# Patient Record
Sex: Female | Born: 1970 | Race: Black or African American | Hispanic: No | Marital: Single | Smoking: Former smoker
Health system: Southern US, Community
[De-identification: ages and names within clinical notes are randomized; demographics above are authoritative.]

## PROBLEM LIST (undated history)

## (undated) DIAGNOSIS — K219 Gastro-esophageal reflux disease without esophagitis: Secondary | ICD-10-CM

## (undated) DIAGNOSIS — I10 Essential (primary) hypertension: Secondary | ICD-10-CM

## (undated) DIAGNOSIS — E119 Type 2 diabetes mellitus without complications: Secondary | ICD-10-CM

## (undated) DIAGNOSIS — E7800 Pure hypercholesterolemia, unspecified: Secondary | ICD-10-CM

## (undated) DIAGNOSIS — E785 Hyperlipidemia, unspecified: Secondary | ICD-10-CM

## (undated) HISTORY — PX: DILATION AND CURETTAGE OF UTERUS: SHX78

## (undated) HISTORY — PX: HERNIA REPAIR: SHX51

---

## 2018-04-23 ENCOUNTER — Other Ambulatory Visit: Payer: Self-pay

## 2018-04-23 ENCOUNTER — Encounter (HOSPITAL_COMMUNITY): Payer: Self-pay

## 2018-04-23 ENCOUNTER — Emergency Department (HOSPITAL_COMMUNITY): Payer: Medicare Other

## 2018-04-23 ENCOUNTER — Emergency Department (HOSPITAL_COMMUNITY)
Admission: EM | Admit: 2018-04-23 | Discharge: 2018-04-23 | Disposition: A | Discharge status: Home / Self Care | Payer: Medicare Other | Attending: Emergency Medicine | Admitting: Emergency Medicine

## 2018-04-23 DIAGNOSIS — Z87891 Personal history of nicotine dependence: Secondary | ICD-10-CM | POA: Insufficient documentation

## 2018-04-23 DIAGNOSIS — R2243 Localized swelling, mass and lump, lower limb, bilateral: Secondary | ICD-10-CM | POA: Diagnosis not present

## 2018-04-23 DIAGNOSIS — I10 Essential (primary) hypertension: Secondary | ICD-10-CM | POA: Insufficient documentation

## 2018-04-23 DIAGNOSIS — R05 Cough: Secondary | ICD-10-CM | POA: Diagnosis not present

## 2018-04-23 DIAGNOSIS — R062 Wheezing: Secondary | ICD-10-CM | POA: Insufficient documentation

## 2018-04-23 DIAGNOSIS — M25471 Effusion, right ankle: Secondary | ICD-10-CM

## 2018-04-23 DIAGNOSIS — M25472 Effusion, left ankle: Secondary | ICD-10-CM

## 2018-04-23 DIAGNOSIS — R059 Cough, unspecified: Secondary | ICD-10-CM

## 2018-04-23 LAB — COMPREHENSIVE METABOLIC PANEL
ALT: 18 U/L (ref 0–44)
AST: 22 U/L (ref 15–41)
Albumin: 3.3 g/dL — ABNORMAL LOW (ref 3.5–5.0)
Alkaline Phosphatase: 66 U/L (ref 38–126)
Anion gap: 12 (ref 5–15)
BUN: <5 mg/dL — ABNORMAL LOW (ref 6–20)
CO2: 22 mmol/L (ref 22–32)
Calcium: 9.1 mg/dL (ref 8.9–10.3)
Chloride: 105 mmol/L (ref 98–111)
Creatinine, Ser: 0.71 mg/dL (ref 0.44–1.00)
GFR calc Af Amer: >60 mL/min (ref >60)
GFR calc non Af Amer: >60 mL/min (ref >60)
Glucose, Bld: 136 mg/dL — ABNORMAL HIGH (ref 70–99)
Potassium: 4.1 mmol/L (ref 3.5–5.1)
Sodium: 139 mmol/L (ref 135–145)
Total Bilirubin: 0.6 mg/dL (ref 0.3–1.2)
Total Protein: 7.1 g/dL (ref 6.5–8.1)

## 2018-04-23 LAB — I-STAT TROPONIN, ED: Troponin i, poc: 0.01 ng/mL (ref 0.00–0.08)

## 2018-04-23 LAB — BRAIN NATRIURETIC PEPTIDE: B Natriuretic Peptide: 40.7 pg/mL (ref 0.0–100.0)

## 2018-04-23 LAB — CBC
HCT: 42 % (ref 36.0–46.0)
Hemoglobin: 13.2 g/dL (ref 12.0–15.0)
MCH: 29.5 pg (ref 26.0–34.0)
MCHC: 31.4 g/dL (ref 30.0–36.0)
MCV: 94 fL (ref 80.0–100.0)
Platelets: 320 10*3/uL (ref 150–400)
RBC: 4.47 MIL/uL (ref 3.87–5.11)
RDW: 12.1 % (ref 11.5–15.5)
WBC: 9.8 10*3/uL (ref 4.0–10.5)
nRBC: 0 % (ref 0.0–0.2)

## 2018-04-23 LAB — CBG MONITORING, ED
Glucose-Capillary: 53 mg/dL — ABNORMAL LOW (ref 70–99)
Glucose-Capillary: 78 mg/dL (ref 70–99)
Glucose-Capillary: 114 mg/dL — ABNORMAL HIGH (ref 70–99)

## 2018-04-23 MED ORDER — HYDROCODONE-ACETAMINOPHEN 5-325 MG PO TABS
1.0000 | ORAL_TABLET | Freq: Once | ORAL | Status: AC
  Administered 2018-04-23: 1 via ORAL
  Filled 2018-04-23: qty 1

## 2018-04-23 MED ORDER — SODIUM CHLORIDE 0.9 % IV SOLN
1.0000 g | Freq: Once | INTRAVENOUS | Status: AC
  Administered 2018-04-23: 1 g via INTRAVENOUS
  Filled 2018-04-23: qty 10

## 2018-04-23 MED ORDER — HYDROCHLOROTHIAZIDE 25 MG PO TABS: 25.0000 mg | ORAL_TABLET | Freq: Every day | ORAL | 0 refills | Status: DC

## 2018-04-23 MED ORDER — AMOXICILLIN 500 MG PO CAPS: 1000.0000 mg | ORAL_CAPSULE | Freq: Three times a day (TID) | ORAL | 0 refills | Status: DC

## 2018-04-23 MED ORDER — AZITHROMYCIN 250 MG PO TABS: 250.0000 mg | ORAL_TABLET | Freq: Every day | ORAL | 0 refills | Status: AC

## 2018-04-23 MED ORDER — ALBUTEROL SULFATE (2.5 MG/3ML) 0.083% IN NEBU
5.0000 mg | INHALATION_SOLUTION | Freq: Once | RESPIRATORY_TRACT | Status: AC
  Administered 2018-04-23: 5 mg via RESPIRATORY_TRACT
  Filled 2018-04-23: qty 6

## 2018-04-23 MED ORDER — SODIUM CHLORIDE 0.9 % IV SOLN
500.0000 mg | Freq: Once | INTRAVENOUS | Status: AC
  Administered 2018-04-23: 500 mg via INTRAVENOUS
  Filled 2018-04-23: qty 500

## 2018-04-23 MED ORDER — ALBUTEROL SULFATE HFA 108 (90 BASE) MCG/ACT IN AERS: 2.0000 | INHALATION_SPRAY | RESPIRATORY_TRACT | 1 refills | Status: DC | PRN

## 2018-04-23 MED ORDER — IPRATROPIUM BROMIDE 0.02 % IN SOLN
0.5000 mg | Freq: Once | RESPIRATORY_TRACT | Status: AC
  Administered 2018-04-23: 0.5 mg via RESPIRATORY_TRACT
  Filled 2018-04-23: qty 2.5

## 2018-04-23 MED ORDER — SODIUM CHLORIDE 0.9 % IV SOLN
INTRAVENOUS | Status: DC | PRN
  Administered 2018-04-23: 500 mL via INTRAVENOUS

## 2018-04-23 MED ORDER — FUROSEMIDE 10 MG/ML IJ SOLN
20.0000 mg | Freq: Once | INTRAMUSCULAR | Status: AC
  Administered 2018-04-23: 20 mg via INTRAVENOUS
  Filled 2018-04-23: qty 2

## 2018-04-23 NOTE — ED Triage Notes (Signed)
Pt endorses cough with mucous, shob x 2 weeks. Hx of pleurisy 3 years ago and this feels similar. Tachy. Axox4, Speaking in complete sentences.

## 2018-04-23 NOTE — Discharge Instructions (Addendum)
It was our pleasure to provide your ER care today - we hope that you feel better.  Take antibiotics as prescribed (amoxicillin + zithromax).  Use albuterol inhaler as need.   For ankle swelling and blood pressure control - limit salt intake, elevate legs, and take HCTZ (diuretic type blood pressure medication) as prescribed.   Follow up with primary care doctor for recheck later this week.  Return to ER right away if worse, increased difficulty breathing, chest pain, weak/fainting, other concern.

## 2018-04-23 NOTE — ED Notes (Addendum)
Pt requested CBG to be checked, CBG 53. Pt given 2 cups of orange juice to drink. Axox4

## 2018-04-23 NOTE — ED Notes (Signed)
Pt stable and ambulatory for discharge, states understanding follow up.  

## 2018-04-23 NOTE — ED Provider Notes (Signed)
[facility] EMERGENCY DEPARTMENT Provider Note   CSN: [id number] Arrival date & time: 04/23/18  1152     History   Chief Complaint Chief Complaint  Patient presents with  . Shortness of Breath    HPI Alexandra Elliott is a 47 y.o. female.  Patient c/o productive cough in the past 1-2 weeks. Cough episodic, persistent, moderate. Occasionally produces greenish/yellow phlegm. Had scratchy/sore throat, but that has improved. +nasal congestion/rhinorrhea. subjective fever.  Denies chest pain or discomfort. No specific known ill contacts. +mild bilateral, symmetric ankle edema. No unilateral leg pain or swelling. Denies pnd. States compliant w home meds.   The history is provided by the patient.  Shortness of Breath  Associated symptoms include a fever, rhinorrhea, sore throat, cough and wheezing. Pertinent negatives include no headaches, no neck pain, no chest pain, no vomiting, no abdominal pain and no rash.    History reviewed. No pertinent past medical history.  There are no active problems to display for this patient.   History reviewed. No pertinent surgical history.   OB History   No obstetric history on file.      Home Medications    Prior to Admission medications   Not on File    Family History History reviewed. No pertinent family history.  Social History Social History   Tobacco Use  . Smoking status: Former Smoker  Substance Use Topics  . Alcohol use: Never    Frequency: Never  . Drug use: Never     Allergies   Levofloxacin and Lisinopril   Review of Systems Review of Systems  Constitutional: Positive for fever.  HENT: Positive for congestion, rhinorrhea and sore throat.   Eyes: Negative for redness.  Respiratory: Positive for cough, shortness of breath and wheezing.   Cardiovascular: Negative for chest pain.       Bil ankle edema.   Gastrointestinal: Negative for abdominal pain, diarrhea and vomiting.  Genitourinary:  Negative for flank pain.  Musculoskeletal: Negative for back pain, neck pain and neck stiffness.  Skin: Negative for rash.  Neurological: Negative for headaches.  Hematological: Does not bruise/bleed easily.  Psychiatric/Behavioral: Negative for confusion.     Physical Exam Updated Vital Signs BP (!) 169/91 (BP Location: Right Arm)   Pulse (!) 120   Temp 98.1 F (36.7 C) (Oral)   Resp 20   Ht 1.6 m (5\' 3" )   Wt (!) 154.2 kg   LMP 04/02/2018 (Exact Date)   SpO2 99%   BMI 60.23 kg/m   Physical Exam Vitals signs and nursing note reviewed.  Constitutional:      Appearance: Normal appearance. She is well-developed.  HENT:     Head: Atraumatic.     Nose: Congestion and rhinorrhea present.     Mouth/Throat:     Mouth: Mucous membranes are moist.     Pharynx: Oropharynx is clear. No oropharyngeal exudate.  Eyes:     General: No scleral icterus.    Conjunctiva/sclera: Conjunctivae normal.     Pupils: Pupils are equal, round, and reactive to light.  Neck:     Musculoskeletal: Normal range of motion and neck supple. No neck rigidity or muscular tenderness.     Trachea: No tracheal deviation.  Cardiovascular:     Rate and Rhythm: Regular rhythm.     Pulses: Normal pulses.     Heart sounds: Normal heart sounds. No murmur. No friction rub. No gallop.   Pulmonary:     Effort: Pulmonary effort is normal. No respiratory  distress.     Breath sounds: Wheezing present.     Comments: Rhonchi left.  Abdominal:     General: Abdomen is flat. Bowel sounds are normal. There is no distension.     Palpations: Abdomen is soft.     Tenderness: There is no abdominal tenderness.     Comments: Obese.   Genitourinary:    Comments: No cva tenderness.  Musculoskeletal:        General: No tenderness.     Comments: Bilateral ankle edema, mild-mod, no asymmetric lower extremity edema. No leg pain/tenderness.   Skin:    General: Skin is warm and dry.     Findings: No rash.  Neurological:      Mental Status: She is alert.     Comments: Speech clear/fluent.   Psychiatric:        Mood and Affect: Mood normal.      ED Treatments / Results  Labs (all labs ordered are listed, but only abnormal results are displayed) Results for orders placed or performed during the hospital encounter of 04/23/18  Comprehensive metabolic panel  Result Value Ref Range   Sodium 139 135 - 145 mmol/L   Potassium 4.1 3.5 - 5.1 mmol/L   Chloride 105 98 - 111 mmol/L   CO2 22 22 - 32 mmol/L   Glucose, Bld 136 (H) 70 - 99 mg/dL   BUN <5 (L) 6 - 20 mg/dL   Creatinine, Ser 5.620.71 0.44 - 1.00 mg/dL   Calcium 9.1 8.9 - 13.010.3 mg/dL   Total Protein 7.1 6.5 - 8.1 g/dL   Albumin 3.3 (L) 3.5 - 5.0 g/dL   AST 22 15 - 41 U/L   ALT 18 0 - 44 U/L   Alkaline Phosphatase 66 38 - 126 U/L   Total Bilirubin 0.6 0.3 - 1.2 mg/dL   GFR calc non Af Amer >60 >60 mL/min   GFR calc Af Amer >60 >60 mL/min   Anion gap 12 5 - 15  CBC  Result Value Ref Range   WBC 9.8 4.0 - 10.5 K/uL   RBC 4.47 3.87 - 5.11 MIL/uL   Hemoglobin 13.2 12.0 - 15.0 g/dL   HCT 86.542.0 78.436.0 - 69.646.0 %   MCV 94.0 80.0 - 100.0 fL   MCH 29.5 26.0 - 34.0 pg   MCHC 31.4 30.0 - 36.0 g/dL   RDW 29.512.1 28.411.5 - 13.215.5 %   Platelets 320 150 - 400 K/uL   nRBC 0.0 0.0 - 0.2 %  Brain natriuretic peptide  Result Value Ref Range   B Natriuretic Peptide 40.7 0.0 - 100.0 pg/mL  CBG monitoring, ED  Result Value Ref Range   Glucose-Capillary 53 (L) 70 - 99 mg/dL  CBG monitoring, ED  Result Value Ref Range   Glucose-Capillary 78 70 - 99 mg/dL  I-stat troponin, ED  Result Value Ref Range   Troponin i, poc 0.01 0.00 - 0.08 ng/mL   Comment 3          CBG monitoring, ED  Result Value Ref Range   Glucose-Capillary 114 (H) 70 - 99 mg/dL   Dg Chest 2 View  Result Date: 04/23/2018 CLINICAL DATA:  Shortness of breath for 2 weeks EXAM: CHEST - 2 VIEW COMPARISON:  11/04/2017 FINDINGS: Diffuse bilateral mild interstitial thickening. No pleural effusion or pneumothorax.  Stable cardiomediastinal silhouette. No acute osseous abnormality. IMPRESSION: Diffuse bilateral interstitial thickening which may reflect mild interstitial edema versus interstitial infection. Electronically Signed   By: Elige KoHetal  Patel  On: 04/23/2018 13:51    EKG EKG Interpretation  Date/Time:  Tuesday April 23 2018 12:21:55 EST Ventricular Rate:  119 PR Interval:  154 QRS Duration: 70 QT Interval:  316 QTC Calculation: 444 R Axis:   4 Text Interpretation:  Sinus tachycardia Low voltage QRS No old tracing to compare Confirmed by Cathren LaineSteinl, Ladd Cen ([id number]) on 04/23/2018 12:31:16 PM   Radiology No results found.  Procedures Procedures (including critical care time)  Medications Ordered in ED Medications  albuterol (PROVENTIL) (2.5 MG/3ML) 0.083% nebulizer solution 5 mg (has no administration in time range)     Initial Impression / Assessment and Plan / ED Course  I have reviewed the triage vital signs and the nursing notes.  Pertinent labs & imaging results that were available during my care of the patient were reviewed by me and considered in my medical decision making (see chart for details).  Cxr. Alb neb.  Reviewed nursing notes and prior charts for additional history.   cxr reviewed - ?mild vascular congestion vs interstitial/infectious change. Pt denies pnd. Does have mild bil ankle edema. Dose of lasix given. Labs added.   Labs reviewed - trop is normal. bnp is not elevated.   Post initial neb, wheezing improved, but mild wheezing persists. Additional neb given. Given prod cough, green/yellow sputum, congestion on exam, will give abx rx.   Initial blood sugar was midlly low (pt hadnt eaten then) - pt given po fluids/food - repeat cbg normal.   Pt currently breathing comfortable, no chest pain. Good air exchange. Pt currently appears stable for d/c.   rec close pcp f/u.  Return precautions provided.     Final Clinical Impressions(s) / ED Diagnoses   Final  diagnoses:  None    ED Discharge Orders    None       Cathren LaineSteinl, Katelyne Galster, MD 04/23/18 1715

## 2018-05-10 ENCOUNTER — Observation Stay (HOSPITAL_COMMUNITY)
Admission: EM | Admit: 2018-05-10 | Discharge: 2018-05-12 | Disposition: A | Discharge status: Home / Self Care | Payer: Medicare Other | Attending: Internal Medicine | Admitting: Internal Medicine

## 2018-05-10 ENCOUNTER — Emergency Department (HOSPITAL_COMMUNITY): Payer: Medicare Other

## 2018-05-10 ENCOUNTER — Encounter (HOSPITAL_COMMUNITY): Payer: Self-pay

## 2018-05-10 DIAGNOSIS — E11649 Type 2 diabetes mellitus with hypoglycemia without coma: Secondary | ICD-10-CM | POA: Insufficient documentation

## 2018-05-10 DIAGNOSIS — K219 Gastro-esophageal reflux disease without esophagitis: Secondary | ICD-10-CM | POA: Diagnosis present

## 2018-05-10 DIAGNOSIS — Z7982 Long term (current) use of aspirin: Secondary | ICD-10-CM | POA: Insufficient documentation

## 2018-05-10 DIAGNOSIS — J181 Lobar pneumonia, unspecified organism: Secondary | ICD-10-CM | POA: Diagnosis not present

## 2018-05-10 DIAGNOSIS — Z6841 Body Mass Index (BMI) 40.0 and over, adult: Secondary | ICD-10-CM | POA: Diagnosis not present

## 2018-05-10 DIAGNOSIS — E119 Type 2 diabetes mellitus without complications: Secondary | ICD-10-CM

## 2018-05-10 DIAGNOSIS — Z833 Family history of diabetes mellitus: Secondary | ICD-10-CM | POA: Diagnosis not present

## 2018-05-10 DIAGNOSIS — Z79899 Other long term (current) drug therapy: Secondary | ICD-10-CM | POA: Diagnosis not present

## 2018-05-10 DIAGNOSIS — J189 Pneumonia, unspecified organism: Secondary | ICD-10-CM

## 2018-05-10 DIAGNOSIS — R000 Tachycardia, unspecified: Secondary | ICD-10-CM | POA: Diagnosis not present

## 2018-05-10 DIAGNOSIS — Z888 Allergy status to other drugs, medicaments and biological substances status: Secondary | ICD-10-CM | POA: Insufficient documentation

## 2018-05-10 DIAGNOSIS — R600 Localized edema: Secondary | ICD-10-CM | POA: Diagnosis not present

## 2018-05-10 DIAGNOSIS — Z87891 Personal history of nicotine dependence: Secondary | ICD-10-CM | POA: Insufficient documentation

## 2018-05-10 DIAGNOSIS — I10 Essential (primary) hypertension: Secondary | ICD-10-CM | POA: Diagnosis present

## 2018-05-10 DIAGNOSIS — J9601 Acute respiratory failure with hypoxia: Secondary | ICD-10-CM | POA: Diagnosis present

## 2018-05-10 DIAGNOSIS — R05 Cough: Secondary | ICD-10-CM | POA: Diagnosis present

## 2018-05-10 DIAGNOSIS — Z881 Allergy status to other antibiotic agents status: Secondary | ICD-10-CM | POA: Insufficient documentation

## 2018-05-10 DIAGNOSIS — Z590 Homelessness: Secondary | ICD-10-CM | POA: Insufficient documentation

## 2018-05-10 DIAGNOSIS — A419 Sepsis, unspecified organism: Secondary | ICD-10-CM | POA: Diagnosis present

## 2018-05-10 DIAGNOSIS — E162 Hypoglycemia, unspecified: Secondary | ICD-10-CM | POA: Diagnosis present

## 2018-05-10 DIAGNOSIS — Z794 Long term (current) use of insulin: Secondary | ICD-10-CM | POA: Insufficient documentation

## 2018-05-10 DIAGNOSIS — R0602 Shortness of breath: Secondary | ICD-10-CM | POA: Diagnosis present

## 2018-05-10 HISTORY — DX: Gastro-esophageal reflux disease without esophagitis: K21.9

## 2018-05-10 HISTORY — DX: Type 2 diabetes mellitus without complications: E11.9

## 2018-05-10 HISTORY — DX: Essential (primary) hypertension: I10

## 2018-05-10 LAB — CBC WITH DIFFERENTIAL/PLATELET
Abs Immature Granulocytes: 0.03 10*3/uL (ref 0.00–0.07)
Basophils Absolute: 0.1 10*3/uL (ref 0.0–0.1)
Basophils Relative: 1 %
Eosinophils Absolute: 0.3 10*3/uL (ref 0.0–0.5)
Eosinophils Relative: 4 %
HCT: 43.2 % (ref 36.0–46.0)
Hemoglobin: 13.7 g/dL (ref 12.0–15.0)
Immature Granulocytes: 0 %
LYMPHS PCT: 39 %
Lymphs Abs: 3.3 10*3/uL (ref 0.7–4.0)
MCH: 29.5 pg (ref 26.0–34.0)
MCHC: 31.7 g/dL (ref 30.0–36.0)
MCV: 92.9 fL (ref 80.0–100.0)
MONOS PCT: 12 %
Monocytes Absolute: 1 10*3/uL (ref 0.1–1.0)
Neutro Abs: 3.7 10*3/uL (ref 1.7–7.7)
Neutrophils Relative %: 44 %
Platelets: 378 10*3/uL (ref 150–400)
RBC: 4.65 MIL/uL (ref 3.87–5.11)
RDW: 11.9 % (ref 11.5–15.5)
WBC: 8.4 10*3/uL (ref 4.0–10.5)
nRBC: 0 % (ref 0.0–0.2)

## 2018-05-10 LAB — COMPREHENSIVE METABOLIC PANEL
ALT: 19 U/L (ref 0–44)
AST: 30 U/L (ref 15–41)
Albumin: 3.6 g/dL (ref 3.5–5.0)
Alkaline Phosphatase: 58 U/L (ref 38–126)
Anion gap: 10 (ref 5–15)
BUN: 7 mg/dL (ref 6–20)
CO2: 26 mmol/L (ref 22–32)
CREATININE: 0.79 mg/dL (ref 0.44–1.00)
Calcium: 9.3 mg/dL (ref 8.9–10.3)
Chloride: 101 mmol/L (ref 98–111)
GFR calc Af Amer: >60 mL/min (ref >60)
GFR calc non Af Amer: >60 mL/min (ref >60)
Glucose, Bld: 153 mg/dL — ABNORMAL HIGH (ref 70–99)
Potassium: 4.6 mmol/L (ref 3.5–5.1)
Sodium: 137 mmol/L (ref 135–145)
Total Bilirubin: 1.3 mg/dL — ABNORMAL HIGH (ref 0.3–1.2)
Total Protein: 7.4 g/dL (ref 6.5–8.1)

## 2018-05-10 LAB — I-STAT BETA HCG BLOOD, ED (MC, WL, AP ONLY): I-stat hCG, quantitative: <5 m[IU]/mL (ref <5)

## 2018-05-10 LAB — BRAIN NATRIURETIC PEPTIDE: B Natriuretic Peptide: 15.1 pg/mL (ref 0.0–100.0)

## 2018-05-10 LAB — D-DIMER, QUANTITATIVE: D-Dimer, Quant: 0.73 ug/mL-FEU — ABNORMAL HIGH (ref 0.00–0.50)

## 2018-05-10 LAB — I-STAT CG4 LACTIC ACID, ED: Lactic Acid, Venous: 1.44 mmol/L (ref 0.5–1.9)

## 2018-05-10 MED ORDER — IOPAMIDOL (ISOVUE-370) INJECTION 76%
100.0000 mL | Freq: Once | INTRAVENOUS | Status: AC | PRN
  Administered 2018-05-10: 100 mL via INTRAVENOUS

## 2018-05-10 MED ORDER — KETOROLAC TROMETHAMINE 30 MG/ML IJ SOLN
30.0000 mg | Freq: Once | INTRAMUSCULAR | Status: AC
  Administered 2018-05-10: 30 mg via INTRAVENOUS
  Filled 2018-05-10: qty 1

## 2018-05-10 MED ORDER — SODIUM CHLORIDE 0.9 % IV BOLUS: 1000.0000 mL | Freq: Once | INTRAVENOUS | Status: DC

## 2018-05-10 MED ORDER — IPRATROPIUM-ALBUTEROL 0.5-2.5 (3) MG/3ML IN SOLN
3.0000 mL | Freq: Once | RESPIRATORY_TRACT | Status: AC
  Administered 2018-05-10: 3 mL via RESPIRATORY_TRACT
  Filled 2018-05-10: qty 3

## 2018-05-10 NOTE — ED Notes (Signed)
Taken to CT at this time. 

## 2018-05-10 NOTE — ED Triage Notes (Signed)
Pt comes via [facility] from [place] [facility] [place], recent diagnoses of URI, now having SOB, worse with movement and walking, unrelieved with MDI, rhonchi in lower lobes, coughing up yellow phlegm, bilateral leg swelling for the last several weeks.

## 2018-05-10 NOTE — ED Provider Notes (Addendum)
[facility] Provider [facility]   CSN: [id number] Arrival date & time: 05/10/18  2106     History   Chief Complaint Chief Complaint  Patient presents with  . Shortness of Breath  . URI    HPI Alexandra Elliott is a 48 y.o. female.  The history is provided by the patient and medical records. No language interpreter was used.  Shortness of Breath  Associated symptoms: chest pain and cough   Associated symptoms: no wheezing   URI  Presenting symptoms: cough   Associated symptoms: no wheezing    Alexandra Elliott is a 48 y.o. female  with a PMH of DM, HTN, GERD who presents to the Emergency Department complaining of persistent cough and shortness of breath. Patient states that her symptoms actually began about mid December.  She was seen in the emergency department on 12/24.  X-ray at that time showed vascular congestion versus infectious process.  She was treated with azithromycin and amoxicillin which she took until completion.  She felt as if she got somewhat better, but her symptoms never truly resolved.  She then began feeling worse 2 or 3 days ago.  She has been using the albuterol inhaler every couple of hours which is not helping anymore.  She was also started on HCTZ for associated lower extremity swelling.  Swelling is bilateral and has been progressively worsening over the last month.  She is very distraught about her persistent coughing fits.  She states that several times over the last week or 2, she has had streaks of blood in the mucus.  She also reports central and left lateral chest discomfort which gets worse when she takes a big breath or coughs.  She gets winded very easily.  She denies any exertional chest pain, but this does worsen her shortness of breath.  She denies any fevers.  No history of CHF.  No recent travel/immobilizations.  Not on OCPs or hormones.   Past Medical History:  Diagnosis Date  . Diabetes mellitus without  complication (HCC)   . GERD (gastroesophageal reflux disease)   . Hypertension     There are no active problems to display for this patient.   Past Surgical History:  Procedure Laterality Date  . DILATION AND CURETTAGE OF UTERUS       OB History   No obstetric history on file.      Home Medications    Prior to Admission medications   Medication Sig Start Date End Date Taking? Authorizing Provider  albuterol (PROVENTIL HFA;VENTOLIN HFA) 108 (90 Base) MCG/ACT inhaler Inhale 2 puffs into the lungs every 4 (four) hours as needed for wheezing or shortness of breath. 04/23/18  Yes Cathren LaineSteinl, Kevin, MD  amLODipine (NORVASC) 10 MG tablet Take 10 mg by mouth daily.   Yes [provider]  aspirin 81 MG EC tablet Take 81 mg by mouth daily.   Yes [provider]  fenofibrate 160 MG tablet Take 160 mg by mouth daily.   Yes [provider]  ferrous sulfate 325 (65 FE) MG EC tablet Take 325 mg by mouth daily.   Yes [provider]  hydrochlorothiazide (HYDRODIURIL) 25 MG tablet Take 1 tablet (25 mg total) by mouth daily. 04/23/18  Yes Cathren LaineSteinl, Kevin, MD  hydrOXYzine (ATARAX/VISTARIL) 25 MG tablet Take 25 mg by mouth 3 (three) times daily.   Yes [provider]  Insulin Glargine, 2 Unit Dial, (TOUJEO MAX SOLOSTAR) 300 UNIT/ML SOPN Inject 40 Units into the  skin daily.   Yes [provider]  insulin lispro (HUMALOG KWIKPEN) 100 UNIT/ML KwikPen Inject 25 Units into the skin See admin instructions. Inject 25 units with each meal, and 10 units with a snack at night.   Yes [provider]  losartan (COZAAR) 25 MG tablet Take 25 mg by mouth daily.   Yes [provider]  lovastatin (MEVACOR) 40 MG tablet Take 40 mg by mouth at bedtime.   Yes [provider]  pantoprazole (PROTONIX) 40 MG tablet Take 40 mg by mouth daily.   Yes [provider]  amoxicillin (AMOXIL) 500 MG capsule Take 2 capsules (1,000 mg total) by mouth  3 (three) times daily. Patient not taking: Reported on 05/10/2018 04/23/18   Cathren Laine, MD  doxycycline (VIBRAMYCIN) 100 MG capsule Take 1 capsule (100 mg total) by mouth 2 (two) times daily. 05/11/18   Dillard Pascal, Chase Picket, PA-C    Family History No family history on file.  Social History Social History   Tobacco Use  . Smoking status: Former Smoker  Substance Use Topics  . Alcohol use: Never    Frequency: Never  . Drug use: Never     Allergies   Levofloxacin and Lisinopril   Review of Systems Review of Systems  Respiratory: Positive for cough and shortness of breath. Negative for wheezing.   Cardiovascular: Positive for chest pain and leg swelling. Negative for palpitations.  All other systems reviewed and are negative.    Physical Exam Updated Vital Signs BP (!) 135/57   Pulse (!) 105   Temp 98.3 F (36.8 C) (Oral)   Resp 18   SpO2 99%   Physical Exam Vitals signs and nursing note reviewed.  Constitutional:      General: She is not in acute distress.    Appearance: She is well-developed.  HENT:     Head: Normocephalic and atraumatic.  Cardiovascular:     Heart sounds: Normal heart sounds. No murmur.     Comments: Tachycardic but regular. Pulmonary:     Effort: Pulmonary effort is normal. No respiratory distress.     Breath sounds: Normal breath sounds.  Abdominal:     General: There is no distension.     Palpations: Abdomen is soft.     Tenderness: There is no abdominal tenderness.  Musculoskeletal:     Right lower leg: Edema present.     Left lower leg: Edema present.  Skin:    General: Skin is warm and dry.  Neurological:     Mental Status: She is alert and oriented to person, place, and time.      ED Treatments / Results  Labs (all labs ordered are listed, but only abnormal results are displayed) Labs Reviewed  COMPREHENSIVE METABOLIC PANEL - Abnormal; Notable for the following components:      Result Value   Glucose, Bld 153 (*)     Total Bilirubin 1.3 (*)    All other components within normal limits  D-DIMER, QUANTITATIVE (NOT AT [facility]) - Abnormal; Notable for the following components:   D-Dimer, Quant 0.73 (*)    All other components within normal limits  CBC WITH DIFFERENTIAL/PLATELET  BRAIN NATRIURETIC PEPTIDE  I-STAT CG4 LACTIC ACID, ED  I-STAT BETA HCG BLOOD, ED ([facility], [facility], [facility] ONLY)    EKG EKG Interpretation  Date/Time:  Friday May 10 2018 21:11:07 EST Ventricular Rate:  105 PR Interval:    QRS Duration: 80 QT Interval:  335 QTC Calculation: 443 R Axis:   -  17 Text Interpretation:  Sinus tachycardia Borderline left axis deviation Low voltage, precordial leads RSR' in V1 or V2, right VCD or RVH No significant change since last tracing Confirmed by Richardean Canal [phone]) on 05/10/2018 9:48:20 PM   Radiology Dg Chest 2 View  Result Date: 05/10/2018 CLINICAL DATA:  Cough EXAM: CHEST - 2 VIEW COMPARISON:  04/23/2018 FINDINGS: Partial consolidation within the left lower lobe posteriorly. No pleural effusion. Normal heart size. No pneumothorax. IMPRESSION: Partial consolidation in the posterior left lower lobe concerning for a pneumonia. Electronically Signed   By: Jasmine Pang M.D.   On: 05/10/2018 22:39   Ct Angio Chest Pe W And/or Wo Contrast  Result Date: 05/10/2018 CLINICAL DATA:  48 y/o F; shortness of breath with bilateral lower extremity swelling. EXAM: CT ANGIOGRAPHY CHEST WITH CONTRAST TECHNIQUE: Multidetector CT imaging of the chest was performed using the standard protocol during bolus administration of intravenous contrast. Multiplanar CT image reconstructions and MIPs were obtained to evaluate the vascular anatomy. CONTRAST:  70 cc Isovue 370 COMPARISON:  05/10/2018 chest radiograph FINDINGS: Cardiovascular: Satisfactory opacification of the pulmonary arteries to the segmental level. No evidence of pulmonary embolism. Normal heart size. No pericardial effusion. Mediastinum/Nodes: No enlarged mediastinal,  hilar, or axillary lymph nodes. Thyroid gland, trachea, and esophagus demonstrate no significant findings. Lungs/Pleura: Left lower lobe consolidation. No pleural effusion or pneumothorax. Upper Abdomen: No acute abnormality. Musculoskeletal: No chest wall abnormality. No acute or significant osseous findings. Review of the MIP images confirms the above findings. IMPRESSION: 1. No pulmonary embolus identified. 2. Left lower lobe pneumonia. Electronically Signed   By: Mitzi Hansen M.D.   On: 05/10/2018 23:58    Procedures Procedures (including critical care time)  Medications Ordered in ED Medications  ipratropium-albuterol (DUONEB) 0.5-2.5 (3) MG/3ML nebulizer solution 3 mL (3 mLs Nebulization Given 05/10/18 2210)  ketorolac (TORADOL) 30 MG/ML injection 30 mg (30 mg Intravenous Given 05/10/18 2257)  iopamidol (ISOVUE-370) 76 % injection 100 mL (100 mLs Intravenous Contrast Given 05/10/18 2333)     Initial Impression / Assessment and Plan / ED Course  I have reviewed the triage vital signs and the nursing notes.  Pertinent labs & imaging results that were available during my care of the patient were reviewed by me and considered in my medical decision making (see chart for details).    Alexandra Elliott is a 48 y.o. female who presents to ED for persistent cough for about a month now.  Associated with lower extremity swelling, chest pain and shortness of breath. EKG unchanged. Trop negative. Afebrile.  Persistently tachycardic.  Does report intermittent hemoptysis.  Seen in the emergency department on 12/24 where she was treated with azithromycin and amoxicillin which she took until completion with no improvement in her symptoms.  She has a normal white count and lactic. BNP wdl.  Chest x-ray shows partial consolidation in the left lower lobe -possible pneumonia.  D-dimer elevated.  Given her persistent tachycardia, symptoms despite antibiotic treatment, hemoptysis, d-dimer was obtained  which was elevated at 0.73.  Proceeded to CT angio negative for PE.  Does confirm left lower lobe pneumonia. Desat with ambulation. Not on o2 at home. Admitted to hospitalist.   Final Clinical Impressions(s) / ED Diagnoses   Final diagnoses:  Community acquired pneumonia of left lower lobe of lung [facility])     Filicia Scogin, Chase Picket, PA-C 05/11/18 1335    Charlynne Pander, MD 05/13/18 [phone]

## 2018-05-11 ENCOUNTER — Observation Stay (HOSPITAL_BASED_OUTPATIENT_CLINIC_OR_DEPARTMENT_OTHER): Payer: Medicare Other

## 2018-05-11 ENCOUNTER — Encounter (HOSPITAL_COMMUNITY): Payer: Self-pay | Admitting: Internal Medicine

## 2018-05-11 DIAGNOSIS — A419 Sepsis, unspecified organism: Secondary | ICD-10-CM | POA: Diagnosis present

## 2018-05-11 DIAGNOSIS — K219 Gastro-esophageal reflux disease without esophagitis: Secondary | ICD-10-CM | POA: Diagnosis not present

## 2018-05-11 DIAGNOSIS — M7989 Other specified soft tissue disorders: Secondary | ICD-10-CM

## 2018-05-11 DIAGNOSIS — J9601 Acute respiratory failure with hypoxia: Secondary | ICD-10-CM | POA: Diagnosis not present

## 2018-05-11 DIAGNOSIS — E119 Type 2 diabetes mellitus without complications: Secondary | ICD-10-CM | POA: Diagnosis not present

## 2018-05-11 DIAGNOSIS — E162 Hypoglycemia, unspecified: Secondary | ICD-10-CM | POA: Diagnosis present

## 2018-05-11 DIAGNOSIS — I10 Essential (primary) hypertension: Secondary | ICD-10-CM | POA: Diagnosis present

## 2018-05-11 DIAGNOSIS — J181 Lobar pneumonia, unspecified organism: Secondary | ICD-10-CM | POA: Diagnosis not present

## 2018-05-11 LAB — RESPIRATORY PANEL BY PCR
Adenovirus: NOT DETECTED
Bordetella pertussis: NOT DETECTED
CORONAVIRUS 229E-RVPPCR: NOT DETECTED
CORONAVIRUS NL63-RVPPCR: NOT DETECTED
Chlamydophila pneumoniae: NOT DETECTED
Coronavirus HKU1: NOT DETECTED
Coronavirus OC43: NOT DETECTED
Influenza A: NOT DETECTED
Influenza B: NOT DETECTED
MYCOPLASMA PNEUMONIAE-RVPPCR: NOT DETECTED
Metapneumovirus: NOT DETECTED
PARAINFLUENZA VIRUS 4-RVPPCR: NOT DETECTED
Parainfluenza Virus 1: NOT DETECTED
Parainfluenza Virus 2: NOT DETECTED
Parainfluenza Virus 3: NOT DETECTED
RESPIRATORY SYNCYTIAL VIRUS-RVPPCR: NOT DETECTED
Rhinovirus / Enterovirus: NOT DETECTED

## 2018-05-11 LAB — INFLUENZA PANEL BY PCR (TYPE A & B)
Influenza A By PCR: NEGATIVE
Influenza B By PCR: NEGATIVE

## 2018-05-11 LAB — CBC WITH DIFFERENTIAL/PLATELET
Abs Immature Granulocytes: 0.02 10*3/uL (ref 0.00–0.07)
Basophils Absolute: 0.1 10*3/uL (ref 0.0–0.1)
Basophils Relative: 1 %
Eosinophils Absolute: 0.2 10*3/uL (ref 0.0–0.5)
Eosinophils Relative: 2 %
HCT: 40.1 % (ref 36.0–46.0)
Hemoglobin: 12.8 g/dL (ref 12.0–15.0)
Immature Granulocytes: 0 %
Lymphocytes Relative: 27 %
Lymphs Abs: 1.8 10*3/uL (ref 0.7–4.0)
MCH: 29.2 pg (ref 26.0–34.0)
MCHC: 31.9 g/dL (ref 30.0–36.0)
MCV: 91.6 fL (ref 80.0–100.0)
Monocytes Absolute: 0.5 10*3/uL (ref 0.1–1.0)
Monocytes Relative: 7 %
Neutro Abs: 4.2 10*3/uL (ref 1.7–7.7)
Neutrophils Relative %: 63 %
Platelets: 385 10*3/uL (ref 150–400)
RBC: 4.38 MIL/uL (ref 3.87–5.11)
RDW: 11.8 % (ref 11.5–15.5)
WBC: 6.6 10*3/uL (ref 4.0–10.5)
nRBC: 0 % (ref 0.0–0.2)

## 2018-05-11 LAB — HIV ANTIBODY (ROUTINE TESTING W REFLEX): HIV SCREEN 4TH GENERATION: NONREACTIVE

## 2018-05-11 LAB — GLUCOSE, CAPILLARY
Glucose-Capillary: 78 mg/dL (ref 70–99)
Glucose-Capillary: 101 mg/dL — ABNORMAL HIGH (ref 70–99)
Glucose-Capillary: 150 mg/dL — ABNORMAL HIGH (ref 70–99)
Glucose-Capillary: 412 mg/dL — ABNORMAL HIGH (ref 70–99)
Glucose-Capillary: 375 mg/dL — ABNORMAL HIGH (ref 70–99)
Glucose-Capillary: 418 mg/dL — ABNORMAL HIGH (ref 70–99)

## 2018-05-11 LAB — BASIC METABOLIC PANEL
Anion gap: 11 (ref 5–15)
BUN: 8 mg/dL (ref 6–20)
CO2: 23 mmol/L (ref 22–32)
Calcium: 9.3 mg/dL (ref 8.9–10.3)
Chloride: 102 mmol/L (ref 98–111)
Creatinine, Ser: 0.82 mg/dL (ref 0.44–1.00)
Glucose, Bld: 93 mg/dL (ref 70–99)
Potassium: 3.6 mmol/L (ref 3.5–5.1)
SODIUM: 136 mmol/L (ref 135–145)

## 2018-05-11 LAB — LACTIC ACID, PLASMA: Lactic Acid, Venous: 1.7 mmol/L (ref 0.5–1.9)

## 2018-05-11 LAB — CBG MONITORING, ED
Glucose-Capillary: 137 mg/dL — ABNORMAL HIGH (ref 70–99)
Glucose-Capillary: 30 mg/dL — CL (ref 70–99)
Glucose-Capillary: 58 mg/dL — ABNORMAL LOW (ref 70–99)

## 2018-05-11 LAB — HEMOGLOBIN A1C
Hgb A1c MFr Bld: 8.9 % — ABNORMAL HIGH (ref 4.8–5.6)
Mean Plasma Glucose: 208.73 mg/dL

## 2018-05-11 LAB — PROCALCITONIN

## 2018-05-11 MED ORDER — LEVALBUTEROL HCL 1.25 MG/0.5ML IN NEBU: 1.2500 mg | INHALATION_SOLUTION | Freq: Four times a day (QID) | RESPIRATORY_TRACT | Status: DC | PRN

## 2018-05-11 MED ORDER — PRAVASTATIN SODIUM 40 MG PO TABS
40.0000 mg | ORAL_TABLET | Freq: Every day | ORAL | Status: DC
  Administered 2018-05-11: 40 mg via ORAL
  Filled 2018-05-11: qty 1

## 2018-05-11 MED ORDER — LOSARTAN POTASSIUM 25 MG PO TABS
25.0000 mg | ORAL_TABLET | Freq: Every day | ORAL | Status: DC
  Administered 2018-05-11 – 2018-05-12 (×2): 25 mg via ORAL
  Filled 2018-05-11 (×2): qty 1

## 2018-05-11 MED ORDER — FENOFIBRATE 160 MG PO TABS
160.0000 mg | ORAL_TABLET | Freq: Every day | ORAL | Status: DC
  Administered 2018-05-11 – 2018-05-12 (×2): 160 mg via ORAL
  Filled 2018-05-11 (×2): qty 1

## 2018-05-11 MED ORDER — HYDROXYZINE HCL 25 MG PO TABS
25.0000 mg | ORAL_TABLET | Freq: Three times a day (TID) | ORAL | Status: DC
  Administered 2018-05-11 – 2018-05-12 (×5): 25 mg via ORAL
  Filled 2018-05-11 (×5): qty 1

## 2018-05-11 MED ORDER — BUTALBITAL-APAP-CAFFEINE 50-325-40 MG PO TABS
1.0000 | ORAL_TABLET | Freq: Four times a day (QID) | ORAL | Status: DC | PRN
  Administered 2018-05-11: 1 via ORAL
  Filled 2018-05-11: qty 1

## 2018-05-11 MED ORDER — SODIUM CHLORIDE 0.9 % IV BOLUS
1000.0000 mL | Freq: Once | INTRAVENOUS | Status: AC
  Administered 2018-05-11: 1000 mL via INTRAVENOUS

## 2018-05-11 MED ORDER — INSULIN ASPART 100 UNIT/ML ~~LOC~~ SOLN
10.0000 [IU] | Freq: Once | SUBCUTANEOUS | Status: AC
  Administered 2018-05-11: 10 [IU] via SUBCUTANEOUS

## 2018-05-11 MED ORDER — SODIUM CHLORIDE 0.9 % IV SOLN
1.0000 g | INTRAVENOUS | Status: DC
  Administered 2018-05-11 – 2018-05-12 (×2): 1 g via INTRAVENOUS
  Filled 2018-05-11 (×2): qty 10

## 2018-05-11 MED ORDER — ASPIRIN EC 81 MG PO TBEC
81.0000 mg | DELAYED_RELEASE_TABLET | Freq: Every day | ORAL | Status: DC
  Administered 2018-05-11 – 2018-05-12 (×2): 81 mg via ORAL
  Filled 2018-05-11 (×2): qty 1

## 2018-05-11 MED ORDER — IPRATROPIUM BROMIDE 0.02 % IN SOLN: 0.5000 mg | RESPIRATORY_TRACT | Status: DC

## 2018-05-11 MED ORDER — DM-GUAIFENESIN ER 30-600 MG PO TB12
1.0000 | ORAL_TABLET | Freq: Two times a day (BID) | ORAL | Status: DC | PRN
  Administered 2018-05-12: 1 via ORAL
  Filled 2018-05-11: qty 1

## 2018-05-11 MED ORDER — SODIUM CHLORIDE 0.9 % IV SOLN
INTRAVENOUS | Status: DC
  Administered 2018-05-11 (×2): via INTRAVENOUS

## 2018-05-11 MED ORDER — FERROUS SULFATE 325 (65 FE) MG PO TABS: 325.0000 mg | ORAL_TABLET | Freq: Every day | ORAL | Status: DC

## 2018-05-11 MED ORDER — IPRATROPIUM BROMIDE 0.02 % IN SOLN
0.5000 mg | Freq: Three times a day (TID) | RESPIRATORY_TRACT | Status: DC
  Administered 2018-05-11 – 2018-05-12 (×5): 0.5 mg via RESPIRATORY_TRACT
  Filled 2018-05-11 (×4): qty 2.5

## 2018-05-11 MED ORDER — FUROSEMIDE 10 MG/ML IJ SOLN
20.0000 mg | Freq: Once | INTRAMUSCULAR | Status: AC
  Administered 2018-05-11: 20 mg via INTRAVENOUS
  Filled 2018-05-11: qty 2

## 2018-05-11 MED ORDER — DEXTROSE 50 % IV SOLN
25.0000 mL | Freq: Once | INTRAVENOUS | Status: AC
  Administered 2018-05-11: 25 mL via INTRAVENOUS
  Filled 2018-05-11: qty 50

## 2018-05-11 MED ORDER — METHYLPREDNISOLONE SODIUM SUCC 125 MG IJ SOLR
60.0000 mg | Freq: Two times a day (BID) | INTRAMUSCULAR | Status: DC
  Administered 2018-05-11 (×2): 60 mg via INTRAVENOUS
  Filled 2018-05-11 (×2): qty 2

## 2018-05-11 MED ORDER — DEXTROSE 50 % IV SOLN: 50.0000 mL | INTRAVENOUS | Status: DC | PRN

## 2018-05-11 MED ORDER — AMLODIPINE BESYLATE 10 MG PO TABS
10.0000 mg | ORAL_TABLET | Freq: Every day | ORAL | Status: DC
  Administered 2018-05-11 – 2018-05-12 (×2): 10 mg via ORAL
  Filled 2018-05-11 (×2): qty 1

## 2018-05-11 MED ORDER — INSULIN GLARGINE (2 UNIT DIAL) 300 UNIT/ML ~~LOC~~ SOPN: 40.0000 [IU] | PEN_INJECTOR | Freq: Every day | SUBCUTANEOUS | Status: DC

## 2018-05-11 MED ORDER — GUAIFENESIN ER 600 MG PO TB12
600.0000 mg | ORAL_TABLET | Freq: Two times a day (BID) | ORAL | Status: DC
  Administered 2018-05-11 – 2018-05-12 (×2): 600 mg via ORAL
  Filled 2018-05-11 (×2): qty 1

## 2018-05-11 MED ORDER — ENOXAPARIN SODIUM 80 MG/0.8ML ~~LOC~~ SOLN
75.0000 mg | SUBCUTANEOUS | Status: DC
  Administered 2018-05-11: 75 mg via SUBCUTANEOUS
  Filled 2018-05-11 (×2): qty 0.8

## 2018-05-11 MED ORDER — LEVALBUTEROL HCL 1.25 MG/0.5ML IN NEBU: 1.2500 mg | INHALATION_SOLUTION | Freq: Four times a day (QID) | RESPIRATORY_TRACT | Status: DC

## 2018-05-11 MED ORDER — DOXYCYCLINE HYCLATE 100 MG PO CAPS: 100.0000 mg | ORAL_CAPSULE | Freq: Two times a day (BID) | ORAL | 0 refills | Status: DC

## 2018-05-11 MED ORDER — LEVALBUTEROL HCL 1.25 MG/0.5ML IN NEBU
1.2500 mg | INHALATION_SOLUTION | Freq: Three times a day (TID) | RESPIRATORY_TRACT | Status: DC
  Administered 2018-05-11 – 2018-05-12 (×5): 1.25 mg via RESPIRATORY_TRACT
  Filled 2018-05-11 (×4): qty 0.5

## 2018-05-11 MED ORDER — DOXYCYCLINE HYCLATE 100 MG PO TABS
100.0000 mg | ORAL_TABLET | Freq: Two times a day (BID) | ORAL | Status: DC
  Administered 2018-05-11 – 2018-05-12 (×4): 100 mg via ORAL
  Filled 2018-05-11 (×4): qty 1

## 2018-05-11 MED ORDER — PREDNISONE 20 MG PO TABS
40.0000 mg | ORAL_TABLET | Freq: Every day | ORAL | Status: DC
  Administered 2018-05-12: 40 mg via ORAL
  Filled 2018-05-11: qty 2

## 2018-05-11 MED ORDER — PANTOPRAZOLE SODIUM 40 MG PO TBEC
40.0000 mg | DELAYED_RELEASE_TABLET | Freq: Every day | ORAL | Status: DC
  Administered 2018-05-11 – 2018-05-12 (×2): 40 mg via ORAL
  Filled 2018-05-11 (×2): qty 1

## 2018-05-11 MED ORDER — FERROUS SULFATE 325 (65 FE) MG PO TABS
325.0000 mg | ORAL_TABLET | Freq: Every day | ORAL | Status: DC
  Administered 2018-05-11 – 2018-05-12 (×2): 325 mg via ORAL
  Filled 2018-05-11 (×2): qty 1

## 2018-05-11 MED ORDER — INSULIN GLARGINE 100 UNIT/ML ~~LOC~~ SOLN
40.0000 [IU] | Freq: Every day | SUBCUTANEOUS | Status: DC
  Administered 2018-05-11 – 2018-05-12 (×2): 40 [IU] via SUBCUTANEOUS
  Filled 2018-05-11 (×2): qty 0.4

## 2018-05-11 MED ORDER — INSULIN ASPART 100 UNIT/ML ~~LOC~~ SOLN
0.0000 [IU] | Freq: Three times a day (TID) | SUBCUTANEOUS | Status: DC
  Administered 2018-05-11: 20 [IU] via SUBCUTANEOUS

## 2018-05-11 NOTE — Discharge Instructions (Signed)
It was my pleasure taking care of you today!   Please take all of your antibiotics until finished!  Call your primary care doctor on Monday morning to schedule a follow-up appointment.  Return to the emergency department for worsening of your breathing, high fevers, new or worsening symptoms, any additional concerns.

## 2018-05-11 NOTE — Progress Notes (Signed)
Per Dr. Clyde Lundborg, change patient's q2hrly  cbg monitoring to q4hrly when normal cbg;s for 2 consecutive tmes.

## 2018-05-11 NOTE — Progress Notes (Signed)
Pt CBG 418 with no ordered HS coverage. NP on-call notified and new order received for pt. Will continue to closely monitor. Pt keep on requesting for soda drink and pt re-educated on decreasing the drink intake as it's affecting her blood sugar levels as well as intake. Dionne Bucy RN

## 2018-05-11 NOTE — Progress Notes (Signed)
Patient was admitted after midnight.  I have personally seen and agree with the overall assessment and plan.  Patient reports complaints of lower extremity swelling she does have +1 pitting edema.  Patient was given 20 mg of Lasix x1 dose.  Will plan on restarting blood pressure medications in the morning blood sugars seem to be mildly elevated.  Discontinued IV Solu-Medrol and started p.o. prednisone 40 mg daily as patient still wheezing on physical exam.  Patient reports multiple sick contacts will check respiratory virus panel has previous influenza screen negative.

## 2018-05-11 NOTE — Progress Notes (Signed)
VASCULAR LAB PRELIMINARY  PRELIMINARY  PRELIMINARY  PRELIMINARY  Bilateral lower extremity venous duplex completed.    Preliminary report:  There is no obvious evidence of DVT or SVT noted in the bilateral lower extremities.   Keoshia Steinmetz, RVT 05/11/2018, 11:33 AM

## 2018-05-11 NOTE — H&P (Signed)
History and Physical    Alexandra Elliott WGN:[id number] DOB: Jan 22, 1971 DOA: 05/10/2018  Referring MD/NP/PA:   PCP: Wilburn Mylar, MD   Patient coming from:  The patient is coming from home.  At baseline, pt is independent for most of ADL.        Chief Complaint: Cough and shortness of breath  HPI: Alexandra Elliott is a 48 y.o. female with medical history significant of diabetes mellitus, hypertension, GERD, morbid obesity, who presents with cough and shortness of breath.  Patient states that she has been having cough and shortness of breath since November.  She completed 2 course of antibiotics, Augmentin and azithromycin with some improvement initially, then symptoms has come back recently.  In the past several days she her shortness of breath has worsened.  She has productive cough with yellow-colored sputum production.  She also has wheezing.  She denies chest pain at rest, but cough induced some chest pain.  She does not have fever or chills.  No tenderness in the calf areas.  She has nausea, no vomiting, diarrhea or abdominal pain currently.  Denies symptoms of UTI.  She has mild bilateral leg edema.  She states that she was ruled out for congestive heart failure.  Patient has low blood pressure 82/20, which improved to 113/69 without giving IV fluid in ED.  Patient was found to have low blood sugar with CBG 30 in ED.  ED Course: pt was found to have WBC 8.4, lactic acid of 1.44, negative troponin, BNP 15.1, negative pregnancy test, d-dimer +0.73, electrolytes renal function okay, temperature normal, tachycardia, tachypnea, oxygen saturation 97% at rest, but desaturated to 86% on ambulation.  Chest x-ray showed left lower lobe infiltration.  CT angiogram is negative for PE, but also showed left lower lobe infiltration.  Patient is placed on telemetry bed of observation.  Review of Systems:   General: no fevers, chills, no body weight gain,  has fatigue HEENT: no blurry vision, hearing  changes or sore throat Respiratory: has dyspnea, coughing, wheezing CV: no chest pain, no palpitations GI: no nausea, vomiting, abdominal pain, diarrhea, constipation GU: no dysuria, burning on urination, increased urinary frequency, hematuria  Ext: has mild leg edema Neuro: no unilateral weakness, numbness, or tingling, no vision change or hearing loss Skin: no rash, no skin tear. MSK: No muscle spasm, no deformity, no limitation of range of movement in spin Heme: No easy bruising.  Travel history: No recent long distant travel.  Allergy:  Allergies  Allergen Reactions  . Levofloxacin Other (See Comments)    Loss of hearing  . Lisinopril Swelling    Had lip swelling but determined not to be related to medication    Past Medical History:  Diagnosis Date  . Diabetes mellitus without complication (HCC)   . GERD (gastroesophageal reflux disease)   . Hypertension     Past Surgical History:  Procedure Laterality Date  . DILATION AND CURETTAGE OF UTERUS      Social History:  reports that she has quit smoking. She does not have any smokeless tobacco history on file. She reports that she does not drink alcohol or use drugs.  Family History:  Family History  Problem Relation Age of Onset  . Diabetes Mellitus II Mother   . Diabetes Mellitus II Father   . Diabetes Mellitus I Brother   . Kidney disease Brother      Prior to Admission medications   Medication Sig Start Date End Date Taking? Authorizing Provider  albuterol (  PROVENTIL HFA;VENTOLIN HFA) 108 (90 Base) MCG/ACT inhaler Inhale 2 puffs into the lungs every 4 (four) hours as needed for wheezing or shortness of breath. 04/23/18  Yes Cathren Laine, MD  amLODipine (NORVASC) 10 MG tablet Take 10 mg by mouth daily.   Yes [provider]  aspirin 81 MG EC tablet Take 81 mg by mouth daily.   Yes [provider]  fenofibrate 160 MG tablet Take 160 mg by mouth daily.   Yes [provider]  ferrous  sulfate 325 (65 FE) MG EC tablet Take 325 mg by mouth daily.   Yes [provider]  hydrochlorothiazide (HYDRODIURIL) 25 MG tablet Take 1 tablet (25 mg total) by mouth daily. 04/23/18  Yes Cathren Laine, MD  hydrOXYzine (ATARAX/VISTARIL) 25 MG tablet Take 25 mg by mouth 3 (three) times daily.   Yes [provider]  Insulin Glargine, 2 Unit Dial, (TOUJEO MAX SOLOSTAR) 300 UNIT/ML SOPN Inject 40 Units into the skin daily.   Yes [provider]  insulin lispro (HUMALOG KWIKPEN) 100 UNIT/ML KwikPen Inject 25 Units into the skin See admin instructions. Inject 25 units with each meal, and 10 units with a snack at night.   Yes [provider]  losartan (COZAAR) 25 MG tablet Take 25 mg by mouth daily.   Yes [provider]  lovastatin (MEVACOR) 40 MG tablet Take 40 mg by mouth at bedtime.   Yes [provider]  pantoprazole (PROTONIX) 40 MG tablet Take 40 mg by mouth daily.   Yes [provider]  amoxicillin (AMOXIL) 500 MG capsule Take 2 capsules (1,000 mg total) by mouth 3 (three) times daily. Patient not taking: Reported on 05/10/2018 04/23/18   Cathren Laine, MD  doxycycline (VIBRAMYCIN) 100 MG capsule Take 1 capsule (100 mg total) by mouth 2 (two) times daily. 05/11/18   Ward, Chase Picket, PA-C    Physical Exam: Vitals:   05/11/18 0130 05/11/18 0200 05/11/18 0245 05/11/18 0315  BP: (!) 120/94 122/78 133/87 (!) 150/81  Pulse: (!) 111 (!) 101 (!) 112 (!) 102  Resp: (!) 23 (!) 21 (!) 27 (!) 25  Temp:      TempSrc:      SpO2: 100% 97% 99% 99%  Weight:       General: Not in acute distress HEENT:       Eyes: PERRL, EOMI, no scleral icterus.       ENT: No discharge from the ears and nose, no pharynx injection, no tonsillar enlargement.        Neck: No JVD, no bruit, no mass felt. Heme: No neck lymph node enlargement. Cardiac: S1/S2, RRR, No murmurs, No gallops or rubs. Respiratory: Has mild wheezing bilaterally. GI: Soft,  nondistended, nontender, no rebound pain, no organomegaly, BS present. GU: No hematuria Ext: has trace leg edema bilaterally. 2+DP/PT pulse bilaterally. Musculoskeletal: No joint deformities, No joint redness or warmth, no limitation of ROM in spin. Skin: No rashes.  Neuro: Alert, oriented X3, cranial nerves II-XII grossly intact, moves all extremities normally.  Psych: Patient is not psychotic, no suicidal or hemocidal ideation.  Labs on Admission: I have personally reviewed following labs and imaging studies  CBC: Recent Labs  Lab 05/10/18 2141  WBC 8.4  NEUTROABS 3.7  HGB 13.7  HCT 43.2  MCV 92.9  PLT 378   Basic Metabolic Panel: Recent Labs  Lab 05/10/18 2141  NA 137  K 4.6  CL 101  CO2 26  GLUCOSE 153*  BUN 7  CREATININE 0.79  CALCIUM 9.3   GFR: Estimated Creatinine Clearance: 124.1 mL/min (by C-G formula based on SCr of 0.79 mg/dL). Liver Function Tests: Recent Labs  Lab 05/10/18 2141  AST 30  ALT 19  ALKPHOS 58  BILITOT 1.3*  PROT 7.4  ALBUMIN 3.6   No results for input(s): LIPASE, AMYLASE in the last 168 hours. No results for input(s): AMMONIA in the last 168 hours. Coagulation Profile: No results for input(s): INR, PROTIME in the last 168 hours. Cardiac Enzymes: No results for input(s): CKTOTAL, CKMB, CKMBINDEX, TROPONINI in the last 168 hours. BNP (last 3 results) No results for input(s): PROBNP in the last 8760 hours. HbA1C: No results for input(s): HGBA1C in the last 72 hours. CBG: Recent Labs  Lab 05/11/18 0044 05/11/18 0111  GLUCAP 30* 58*   Lipid Profile: No results for input(s): CHOL, HDL, LDLCALC, TRIG, CHOLHDL, LDLDIRECT in the last 72 hours. Thyroid Function Tests: No results for input(s): TSH, T4TOTAL, FREET4, T3FREE, THYROIDAB in the last 72 hours. Anemia Panel: No results for input(s): VITAMINB12, FOLATE, FERRITIN, TIBC, IRON, RETICCTPCT in the last 72 hours. Urine analysis: No results found for: COLORURINE, APPEARANCEUR,  LABSPEC, PHURINE, GLUCOSEU, HGBUR, BILIRUBINUR, KETONESUR, PROTEINUR, UROBILINOGEN, NITRITE, LEUKOCYTESUR Sepsis Labs: @LABRCNTIP (procalcitonin:4,lacticidven:4) )No results found for this or any previous visit (from the past 240 hour(s)).   Radiological Exams on Admission: Dg Chest 2 View  Result Date: 05/10/2018 CLINICAL DATA:  Cough EXAM: CHEST - 2 VIEW COMPARISON:  04/23/2018 FINDINGS: Partial consolidation within the left lower lobe posteriorly. No pleural effusion. Normal heart size. No pneumothorax. IMPRESSION: Partial consolidation in the posterior left lower lobe concerning for a pneumonia. Electronically Signed   By: Jasmine PangKim  Fujinaga M.D.   On: 05/10/2018 22:39   Ct Angio Chest Pe W And/or Wo Contrast  Result Date: 05/10/2018 CLINICAL DATA:  48 y/o F; shortness of breath with bilateral lower extremity swelling. EXAM: CT ANGIOGRAPHY CHEST WITH CONTRAST TECHNIQUE: Multidetector CT imaging of the chest was performed using the standard protocol during bolus administration of intravenous contrast. Multiplanar CT image reconstructions and MIPs were obtained to evaluate the vascular anatomy. CONTRAST:  70 cc Isovue 370 COMPARISON:  05/10/2018 chest radiograph FINDINGS: Cardiovascular: Satisfactory opacification of the pulmonary arteries to the segmental level. No evidence of pulmonary embolism. Normal heart size. No pericardial effusion. Mediastinum/Nodes: No enlarged mediastinal, hilar, or axillary lymph nodes. Thyroid gland, trachea, and esophagus demonstrate no significant findings. Lungs/Pleura: Left lower lobe consolidation. No pleural effusion or pneumothorax. Upper Abdomen: No acute abnormality. Musculoskeletal: No chest wall abnormality. No acute or significant osseous findings. Review of the MIP images confirms the above findings. IMPRESSION: 1. No pulmonary embolus identified. 2. Left lower lobe pneumonia. Electronically Signed   By: Mitzi HansenLance  Furusawa-Stratton M.D.   On: 05/10/2018 23:58      EKG: Independently reviewed.  Sinus rhythm, QTC 443, low voltage, LAD, nonspecific T wave change.  Assessment/Plan Principal Problem:   Lobar pneumonia (HCC) Active Problems:   Diabetes mellitus without complication (HCC)   GERD (gastroesophageal reflux disease)   Hypertension   Sepsis (HCC)   Acute respiratory failure with hypoxia (HCC)   Hypoglycemia  Acute respiratory failure with hypoxia and sepsis due to lobar pneumonia [facility](HCC): Patient has positive d-dimer, CT angiogram is negative for PE. CXR and CTA both showed left lower lobe infiltration.  Patient does not have fever or leukocytosis, but she has productive cough, shortness of breath, pleuritic chest pain, indicating possible pneumonia.  She has oxygen desaturation ambulation.  Patient meets  critical for sepsis with hypotension, tachycardia and tachypnea.  Lactic acid is normal.  Initially hypotensive, but currently hemodynamically stable.  Blood pressure 113/69 currently.  - Will place on telemetry bed for obs. - IV Rocephin and doxycycline - Mucinex for cough  - Atrovent nebs and Xopenex Neb prn for SOB - solumedrol 60 mg bid by IV for wheezing - Urine legionella and S. pneumococcal antigen - Follow up blood culture x2, sputum culture and plus Flu pcr - will get Procalcitonin and trend lactic acid level per sepsis protocol - IVF: 1L of NS bolus in ED, followed by 100 mL per hour of NS (lactic acid is normal)   Hypoglycemia: CBG 30 initially.  Likely due to decreased oral intake and continuation of insulin (glargine and Humalog). -Hold insulin - CBG every 2 hours - D50 as needed  Diabetes mellitus without complication (HCC): Last A1c not on record. Patient is taking glargine insulin and Humalog at home. -Hold insulin due to hypoglycemia -Check A1c  GERD: -Protonix  Essential hypertension: -IV Hydralazine prn -Continue home medications due to hypotension   DVT ppx: SQ Lovenox Code Status: Full code Family  Communication:   Yes, patient's son and fianc at bed side Disposition Plan:  Anticipate discharge back to previous home environment Consults called: None Admission status: Obs / tele      Date of Service 05/11/2018    Lorretta HarpXilin Jarett Dralle [facility] Pager [phone]  If 7PM-7AM, please contact night-coverage [email or web address] Password [email or web address] 05/11/2018, 3:20 AM

## 2018-05-11 NOTE — ED Notes (Signed)
Oxygen dropped to 86% on RA while ambulating, HR 126, pt very winded, once resting oxygen returned to 99% on RA

## 2018-05-11 NOTE — ED Notes (Signed)
Pt given OJ and turkey sandwich.

## 2018-05-12 ENCOUNTER — Other Ambulatory Visit: Payer: Self-pay

## 2018-05-12 ENCOUNTER — Encounter (HOSPITAL_COMMUNITY): Payer: Self-pay | Admitting: *Deleted

## 2018-05-12 DIAGNOSIS — J9601 Acute respiratory failure with hypoxia: Secondary | ICD-10-CM | POA: Diagnosis not present

## 2018-05-12 DIAGNOSIS — E119 Type 2 diabetes mellitus without complications: Secondary | ICD-10-CM | POA: Diagnosis not present

## 2018-05-12 DIAGNOSIS — A419 Sepsis, unspecified organism: Secondary | ICD-10-CM | POA: Diagnosis not present

## 2018-05-12 DIAGNOSIS — J181 Lobar pneumonia, unspecified organism: Secondary | ICD-10-CM | POA: Diagnosis not present

## 2018-05-12 DIAGNOSIS — K219 Gastro-esophageal reflux disease without esophagitis: Secondary | ICD-10-CM

## 2018-05-12 DIAGNOSIS — I10 Essential (primary) hypertension: Secondary | ICD-10-CM

## 2018-05-12 DIAGNOSIS — E162 Hypoglycemia, unspecified: Secondary | ICD-10-CM

## 2018-05-12 LAB — GLUCOSE, CAPILLARY
Glucose-Capillary: 406 mg/dL — ABNORMAL HIGH (ref 70–99)
Glucose-Capillary: 403 mg/dL — ABNORMAL HIGH (ref 70–99)
Glucose-Capillary: 290 mg/dL — ABNORMAL HIGH (ref 70–99)
Glucose-Capillary: 221 mg/dL — ABNORMAL HIGH (ref 70–99)

## 2018-05-12 LAB — STREP PNEUMONIAE URINARY ANTIGEN: Strep Pneumo Urinary Antigen: NEGATIVE

## 2018-05-12 MED ORDER — DOXYCYCLINE HYCLATE 100 MG PO TABS: 100.0000 mg | ORAL_TABLET | Freq: Two times a day (BID) | ORAL | 0 refills | Status: DC

## 2018-05-12 MED ORDER — INSULIN ASPART 100 UNIT/ML ~~LOC~~ SOLN
25.0000 [IU] | Freq: Once | SUBCUTANEOUS | Status: AC
  Administered 2018-05-12: 25 [IU] via SUBCUTANEOUS

## 2018-05-12 MED ORDER — INSULIN LISPRO (1 UNIT DIAL) 100 UNIT/ML (KWIKPEN): 25.0000 [IU] | PEN_INJECTOR | SUBCUTANEOUS | 1 refills | Status: AC

## 2018-05-12 MED ORDER — GUAIFENESIN ER 600 MG PO TB12: 600.0000 mg | ORAL_TABLET | Freq: Two times a day (BID) | ORAL | 0 refills | Status: DC

## 2018-05-12 MED ORDER — ALBUTEROL SULFATE HFA 108 (90 BASE) MCG/ACT IN AERS: 2.0000 | INHALATION_SPRAY | RESPIRATORY_TRACT | 0 refills | Status: AC | PRN

## 2018-05-12 MED ORDER — INSULIN GLARGINE (2 UNIT DIAL) 300 UNIT/ML ~~LOC~~ SOPN: 40.0000 [IU] | PEN_INJECTOR | Freq: Every day | SUBCUTANEOUS | 0 refills | Status: AC

## 2018-05-12 MED ORDER — INSULIN ASPART 100 UNIT/ML ~~LOC~~ SOLN
25.0000 [IU] | Freq: Three times a day (TID) | SUBCUTANEOUS | Status: DC
  Administered 2018-05-12: 25 [IU] via SUBCUTANEOUS

## 2018-05-12 MED ORDER — LOSARTAN POTASSIUM 25 MG PO TABS: 25.0000 mg | ORAL_TABLET | Freq: Every day | ORAL | 0 refills | Status: AC

## 2018-05-12 MED ORDER — FUROSEMIDE 20 MG PO TABS: 20.0000 mg | ORAL_TABLET | Freq: Every day | ORAL | 0 refills | Status: AC

## 2018-05-12 MED ORDER — DOXYCYCLINE HYCLATE 100 MG PO CAPS: 100.0000 mg | ORAL_CAPSULE | Freq: Two times a day (BID) | ORAL | 0 refills | Status: DC

## 2018-05-12 MED ORDER — ASPIRIN 81 MG PO TBEC: 81.0000 mg | DELAYED_RELEASE_TABLET | Freq: Every day | ORAL | 0 refills | Status: AC

## 2018-05-12 NOTE — Care Management (Signed)
Call received regarding prescriptions for pt d/c. Pt has Medicaid, so unfortunately, so assistance is available as prescriptions are $3/each.  MD advised.

## 2018-05-12 NOTE — Progress Notes (Signed)
CSW met with patient to discuss homelessness resources. CSW noted patient's fiance and 50 year-old son were present. CSW spoke with family at some length about their current situation. In summary; patient receives SSI to a debit card from [email or web address]. Patient attempted to pay for a month of motel stay and was double charged by [employer]. While fixing the transation SSI requested a payback. [employer] locked the account and card. The family became homeless in response. The patient started to struggle with symptoms of pneumonia at this time. They family was on the process of moving to [place] however without access to their money they have become stuck in [place] homeless the last two weeks. CSW offered homelessness resources. CSW is concerned regarding the 26 year-old son with autism being homeless and will follow up with a report to CPS. CSW notes per family a new card has been issued and should be delivered to the post office where they reported their mail is being held. CSW discussed [employer] resources and other homeless resources in depth with family. CSW will follow pending discharge needs.  Lamonte Richer, LCSW, [place] Worker II [phone]

## 2018-05-12 NOTE — Progress Notes (Signed)
SATURATION QUALIFICATIONS: (This note is used to comply with regulatory documentation for home oxygen)  Patient Saturations on Room Air at Rest = 98%  Patient Saturations on Room Air while Ambulating = 94%  Patient Saturations on  Liters of oxygen while Ambulating = %  Please briefly explain why patient needs home oxygen: n/a

## 2018-05-12 NOTE — Progress Notes (Signed)
Assumed care from [place], [place]. Patient independent and stable. Family in the room with her.

## 2018-05-12 NOTE — Progress Notes (Signed)
Pt discharge education and instructions completed with pt and family at bedside; all voices understanding and denies any questions. Pt IV and telemetry removed; pt discharge home and vouchers provided by CSW and MD token of $20 handed to pt pay for her medications. Pt to pick up electronically sent prescriptions from preferred pharmacy on file and handed her prescriptions for doxycycline and albuterol. Pt transported off unit via wheelchair with belongings and family to the side. Dionne Bucy RN

## 2018-05-12 NOTE — Progress Notes (Signed)
Patient is saturating at 96% this morning on room air. Patient will ambulate after breakfast and recheck o2.

## 2018-05-12 NOTE — Progress Notes (Signed)
Patient is need shelter resource forms, SW called and left a message.

## 2018-05-12 NOTE — Discharge Summary (Addendum)
Alexandra Elliott, is a 48 y.o. female  DOB March 28, 1971  MRN [id number].  Admission date:  05/10/2018  Admitting Physician  Lorretta Harp, MD  Discharge Date:  05/12/2018   Primary MD  Wilburn Mylar, MD  Recommendations for primary care physician for things to follow:      Admission Diagnosis  Community acquired pneumonia of left lower lobe of lung [facility]) [J18.1]   Discharge Diagnosis  Community acquired pneumonia of left lower lobe of lung (HCC) [J18.1]    Principal Problem:   Lobar pneumonia (HCC) Active Problems:   Diabetes mellitus without complication (HCC)   GERD (gastroesophageal reflux disease)   Hypertension   Sepsis (HCC)   Acute respiratory failure with hypoxia (HCC)   Hypoglycemia      Past Medical History:  Diagnosis Date  . Diabetes mellitus without complication (HCC)   . GERD (gastroesophageal reflux disease)   . Hypertension     Past Surgical History:  Procedure Laterality Date  . DILATION AND CURETTAGE OF UTERUS         HPI  from the history and physical done on the day of admission:     Alexandra Elliott is a 48 y.o. female with medical history significant of diabetes mellitus, hypertension, GERD, morbid obesity, who presents with cough and shortness of breath.  Patient states that she has been having cough and shortness of breath since November.  She completed 2 course of antibiotics, Augmentin and azithromycin with some improvement initially, then symptoms has come back recently.  In the past several days she her shortness of breath has worsened.  She has productive cough with yellow-colored sputum production.  She also has wheezing.  She denies chest pain at rest, but cough induced some chest pain.  She does not have fever or chills.  No tenderness in the calf areas.  She has nausea, no vomiting,  diarrhea or abdominal pain currently.  Denies symptoms of UTI.  She has mild bilateral leg edema.  She states that she was ruled out for congestive heart failure.  Patient has low blood pressure 82/20, which improved to 113/69 without giving IV fluid in ED.  Patient was found to have low blood sugar with CBG 30 in ED.     Hospital Course:   1. Acute respiratory failure with hypoxia secondary to sepsis due to community-acquired pneumonia: Chest x-ray showed a left lower lobe pneumonia.  Although afebrile patient was found to be tachycardic, tachypneic, and have initial O2 saturations of 79% on room air requiring placement to 2 L of nasal cannula oxygen initially.  CT angiogram obtained due to elevated d-dimer did not show any signs of a PE or fluid overload.  Sepsis protocol has been initiated and patient was started on antibiotics of Rocephin and doxycycline.  Patient was found to have negative influenza and respiratory virus panel screening.  She received IV steroids and breathing treatments for wheezing appreciated.  Breathing symptoms improved.  Patient was able to ambulate without drop in O2 saturations with nursing on  hospital day 2.  Oral antibiotics of doxycycline continue at discharge to complete course.  2.Hypoglycemia/hyperglycemia in type 2 diabetes mellitus: Patient noted to have blood glucose initially in the 30s.  Patient's home insulins were initially held.  With recent illness patient has not been eating like normal.  However, repeat CBG testing the following day noted blood glucose of 400.  Patient's home insulin regimen was restarted.  Hemoglobin A1c noted to be 8.9.    2. Essential hypertension: Patient blood pressures were initially documented as low as 82/20 for which blood pressure medications were held.  On reassessment later in the day patient noted to have elevated blood pressures for which amlodipine and losartan were restarted.  3. GERD: Patient was continued on Protonix  4.  Morbid obesity: BMI noted to be 59.48.  Patient was advised on the need of weight loss. Follow UP  Follow-up Information    Wilburn Mylar, MD.   Specialty:  [facility] Medicine Contact information: [address] [place] [address] [phone]            Consults obtained: Case management  Discharge Condition: Good  Diet and Activity recommendation: See Discharge Instructions below  Discharge Instructions    Patient was advised to follow-up with her primary care provider within 1 week.     Discharge Medications     Allergies as of 05/12/2018      Reactions   Levofloxacin Other (See Comments)   Loss of hearing   Lisinopril Swelling   Had lip swelling but determined not to be related to medication      Medication List    STOP taking these medications   amoxicillin 500 MG capsule Commonly known as:  AMOXIL   hydrochlorothiazide 25 MG tablet Commonly known as:  HYDRODIURIL     TAKE these medications   albuterol 108 (90 Base) MCG/ACT inhaler Commonly known as:  PROVENTIL HFA;VENTOLIN HFA Inhale 2 puffs into the lungs every 4 (four) hours as needed for wheezing or shortness of breath.   amLODipine 10 MG tablet Commonly known as:  NORVASC Take 10 mg by mouth daily.   aspirin 81 MG EC tablet Take 1 tablet (81 mg total) by mouth daily.   doxycycline 100 MG capsule Commonly known as:  VIBRAMYCIN Take 1 capsule (100 mg total) by mouth 2 (two) times daily.   fenofibrate 160 MG tablet Take 160 mg by mouth daily.   ferrous sulfate 325 (65 FE) MG EC tablet Take 325 mg by mouth daily.   furosemide 20 MG tablet Commonly known as:  LASIX Take 1 tablet (20 mg total) by mouth daily.   guaiFENesin 600 MG 12 hr tablet Commonly known as:  MUCINEX Take 1 tablet (600 mg total) by mouth 2 (two) times daily.   hydrOXYzine 25 MG tablet Commonly known as:  ATARAX/VISTARIL Take 25 mg by mouth 3 (three) times daily.   Insulin Glargine (2 Unit Dial) 300  UNIT/ML Sopn Commonly known as:  TOUJEO MAX SOLOSTAR Inject 40 Units into the skin daily.   insulin lispro 100 UNIT/ML KwikPen Commonly known as:  HUMALOG KWIKPEN Inject 0.25 mLs (25 Units total) into the skin See admin instructions. Inject 25 units with each meal, and 10 units with a snack at night.   losartan 25 MG tablet Commonly known as:  COZAAR Take 1 tablet (25 mg total) by mouth daily.   lovastatin 40 MG tablet Commonly known as:  MEVACOR Take 40 mg by mouth at bedtime.  pantoprazole 40 MG tablet Commonly known as:  PROTONIX Take 40 mg by mouth daily.       Major procedures and Radiology Reports - PLEASE review detailed and final reports for all details, in brief -      Dg Chest 2 View  Result Date: 05/10/2018 CLINICAL DATA:  Cough EXAM: CHEST - 2 VIEW COMPARISON:  04/23/2018 FINDINGS: Partial consolidation within the left lower lobe posteriorly. No pleural effusion. Normal heart size. No pneumothorax. IMPRESSION: Partial consolidation in the posterior left lower lobe concerning for a pneumonia. Electronically Signed   By: Jasmine Pang M.D.   On: 05/10/2018 22:39   Dg Chest 2 View  Result Date: 04/23/2018 CLINICAL DATA:  Shortness of breath for 2 weeks EXAM: CHEST - 2 VIEW COMPARISON:  11/04/2017 FINDINGS: Diffuse bilateral mild interstitial thickening. No pleural effusion or pneumothorax. Stable cardiomediastinal silhouette. No acute osseous abnormality. IMPRESSION: Diffuse bilateral interstitial thickening which may reflect mild interstitial edema versus interstitial infection. Electronically Signed   By: Elige Ko   On: 04/23/2018 13:51   Ct Angio Chest Pe W And/or Wo Contrast  Result Date: 05/10/2018 CLINICAL DATA:  48 y/o F; shortness of breath with bilateral lower extremity swelling. EXAM: CT ANGIOGRAPHY CHEST WITH CONTRAST TECHNIQUE: Multidetector CT imaging of the chest was performed using the standard protocol during bolus administration of intravenous  contrast. Multiplanar CT image reconstructions and MIPs were obtained to evaluate the vascular anatomy. CONTRAST:  70 cc Isovue 370 COMPARISON:  05/10/2018 chest radiograph FINDINGS: Cardiovascular: Satisfactory opacification of the pulmonary arteries to the segmental level. No evidence of pulmonary embolism. Normal heart size. No pericardial effusion. Mediastinum/Nodes: No enlarged mediastinal, hilar, or axillary lymph nodes. Thyroid gland, trachea, and esophagus demonstrate no significant findings. Lungs/Pleura: Left lower lobe consolidation. No pleural effusion or pneumothorax. Upper Abdomen: No acute abnormality. Musculoskeletal: No chest wall abnormality. No acute or significant osseous findings. Review of the MIP images confirms the above findings. IMPRESSION: 1. No pulmonary embolus identified. 2. Left lower lobe pneumonia. Electronically Signed   By: Mitzi Hansen M.D.   On: 05/10/2018 23:58   Vas [place] Lower Extremity Venous (dvt)  Result Date: 05/11/2018  Lower Venous Study Indications: Swelling, SOB, and Morbid obesity.  Risk Factors: Pneumonia. Limitations: Body habitus. Comparison Study: No prior study on file Performing Technologist: Sherren Kerns RVS  Examination Guidelines: A complete evaluation includes B-mode imaging, spectral Doppler, color Doppler, and power Doppler as needed of all accessible portions of each vessel. Bilateral testing is considered an integral part of a complete examination. Limited examinations for reoccurring indications may be performed as noted.  Right Venous Findings: +---------+---------------+---------+-----------+----------+----------------+          CompressibilityPhasicitySpontaneityPropertiesSummary          +---------+---------------+---------+-----------+----------+----------------+ CFV      Full           Yes      Yes                                   +---------+---------------+---------+-----------+----------+----------------+ SFJ       Full                                                          +---------+---------------+---------+-----------+----------+----------------+ FV Prox  Full  visualized       +---------+---------------+---------+-----------+----------+----------------+ FV Mid                                                visualized       +---------+---------------+---------+-----------+----------+----------------+ FV Distal                                             visualized       +---------+---------------+---------+-----------+----------+----------------+ PFV                                                   Not visualized   +---------+---------------+---------+-----------+----------+----------------+ POP      Full           Yes      Yes                                   +---------+---------------+---------+-----------+----------+----------------+ PTV      Full                                                          +---------+---------------+---------+-----------+----------+----------------+ PERO     Full                                         Not all segments +---------+---------------+---------+-----------+----------+----------------+  Left Venous Findings: +---------+---------------+---------+-----------+----------+--------------+          CompressibilityPhasicitySpontaneityPropertiesSummary        +---------+---------------+---------+-----------+----------+--------------+ CFV      Full           Yes      Yes                                 +---------+---------------+---------+-----------+----------+--------------+ SFJ      Full                                                        +---------+---------------+---------+-----------+----------+--------------+ FV Prox  Full                                                        +---------+---------------+---------+-----------+----------+--------------+ FV  Mid   Full                                                        +---------+---------------+---------+-----------+----------+--------------+  FV DistalFull                                                        +---------+---------------+---------+-----------+----------+--------------+ PFV                                                   Not visualized +---------+---------------+---------+-----------+----------+--------------+ POP      Full           Yes      Yes                                 +---------+---------------+---------+-----------+----------+--------------+ PTV      Full                                                        +---------+---------------+---------+-----------+----------+--------------+ PERO     Full                                                        +---------+---------------+---------+-----------+----------+--------------+    Summary: Right: There is no evidence of deep vein thrombosis in the lower extremity. However, portions of this examination were limited- see technologist comments above. Left: There is no evidence of deep vein thrombosis in the lower extremity.  *See table(s) above for measurements and observations. Electronically signed by Sherald Hesshristopher Clark MD on 05/11/2018 at 2:11:23 PM.    Final     Micro Results    Recent Results (from the past 240 hour(s))  Culture, blood (x 2)     Status: None (Preliminary result)   Collection Time: 05/11/18  6:33 AM  Result Value Ref Range Status   Specimen Description BLOOD LEFT ARM  Final   Special Requests   Final    BOTTLES DRAWN AEROBIC ONLY Blood Culture results may not be optimal due to an inadequate volume of blood received in culture bottles   Culture NO GROWTH < 12 HOURS  Final   Report Status PENDING  Incomplete  Culture, blood (x 2)     Status: None (Preliminary result)   Collection Time: 05/11/18  6:33 AM  Result Value Ref Range Status   Specimen Description BLOOD RIGHT  HAND  Final   Special Requests   Final    BOTTLES DRAWN AEROBIC ONLY Blood Culture adequate volume   Culture NO GROWTH < 12 HOURS  Final   Report Status PENDING  Incomplete  Respiratory Panel by PCR     Status: None   Collection Time: 05/11/18  4:20 PM  Result Value Ref Range Status   Adenovirus NOT DETECTED NOT DETECTED Final   Coronavirus 229E NOT DETECTED NOT DETECTED Final   Coronavirus HKU1 NOT DETECTED NOT DETECTED Final   Coronavirus NL63 NOT DETECTED NOT DETECTED Final   Coronavirus OC43 NOT DETECTED NOT DETECTED Final  Metapneumovirus NOT DETECTED NOT DETECTED Final   Rhinovirus / Enterovirus NOT DETECTED NOT DETECTED Final   Influenza A NOT DETECTED NOT DETECTED Final   Influenza B NOT DETECTED NOT DETECTED Final   Parainfluenza Virus 1 NOT DETECTED NOT DETECTED Final   Parainfluenza Virus 2 NOT DETECTED NOT DETECTED Final   Parainfluenza Virus 3 NOT DETECTED NOT DETECTED Final   Parainfluenza Virus 4 NOT DETECTED NOT DETECTED Final   Respiratory Syncytial Virus NOT DETECTED NOT DETECTED Final   Bordetella pertussis NOT DETECTED NOT DETECTED Final   Chlamydophila pneumoniae NOT DETECTED NOT DETECTED Final   Mycoplasma pneumoniae NOT DETECTED NOT DETECTED Final    Comment: Performed at [facility], [address] [place], [place] [address]       Today   Subjective    Alexandra Elliott today states that her breathing is mildly improved, but she still coughing.  Patient also reports being homeless due to issues rent and is currently waiting checks.   Objective   Blood pressure 132/80, pulse (!) 106, temperature 98.5 F (36.9 C), temperature source Oral, resp. rate 19, height 5\' 3"  (1.6 m), weight (!) 152.3 kg, SpO2 99 %.   Intake/Output Summary (Last 24 hours) at 05/12/2018 0555 Last data filed at 05/12/2018 0519 Gross per 24 hour  Intake 2210.19 ml  Output 2100 ml  Net 110.19 ml    Exam  Constitutional: NAD, calm, comfortable Eyes: PERRL, lids and  conjunctivae normal ENMT: Mucous membranes are moist. Posterior pharynx clear of any exudate or lesions..  Neck: normal, supple, no masses, no thyromegaly Respiratory: Decreased overall aeration with no significant wheeze appreciated.  Intermittently coughs, but is able to talk in complete sentences at this time. Cardiovascular: Tachycardic, no murmurs / rubs / gallops.  Trace extremity edema. 2+ pedal pulses. No carotid bruits.  Abdomen: no tenderness, no masses palpated. No hepatosplenomegaly. Bowel sounds positive.  Musculoskeletal: no clubbing / cyanosis. No joint deformity upper and lower extremities. Good ROM, no contractures. Normal muscle tone.  Skin: no rashes, lesions, ulcers. No induration Neurologic: CN 2-12 grossly intact. Sensation intact, DTR normal. Strength 5/5 in all 4.  Psychiatric: Normal judgment and insight. Alert and oriented x 3. Normal mood.    Data Review   CBC w Diff:  Lab Results  Component Value Date   WBC 6.6 05/11/2018   HGB 12.8 05/11/2018   HCT 40.1 05/11/2018   PLT 385 05/11/2018   LYMPHOPCT 27 05/11/2018   MONOPCT 7 05/11/2018   EOSPCT 2 05/11/2018   BASOPCT 1 05/11/2018    CMP:  Lab Results  Component Value Date   NA 136 05/11/2018   K 3.6 05/11/2018   CL 102 05/11/2018   CO2 23 05/11/2018   BUN 8 05/11/2018   CREATININE 0.82 05/11/2018   PROT 7.4 05/10/2018   ALBUMIN 3.6 05/10/2018   BILITOT 1.3 (H) 05/10/2018   ALKPHOS 58 05/10/2018   AST 30 05/10/2018   ALT 19 05/10/2018  .   Total Time in preparing paper work, data evaluation and todays exam - 35 minutes  Clydie Braun M.D on 05/12/2018 at 5:55 AM  [facility]   Office  [phone]

## 2018-05-12 NOTE — Plan of Care (Signed)
  Problem: Activity: Goal: Ability to tolerate increased activity will improve Note:  Patient walked with no desating.

## 2018-05-13 LAB — LEGIONELLA PNEUMOPHILA SEROGP 1 UR AG: L. pneumophila Serogp 1 Ur Ag: NEGATIVE

## 2018-05-16 LAB — CULTURE, BLOOD (ROUTINE X 2)
Culture: NO GROWTH
Culture: NO GROWTH
Special Requests: ADEQUATE

## 2018-05-17 ENCOUNTER — Emergency Department (HOSPITAL_COMMUNITY)
Admission: EM | Admit: 2018-05-17 | Discharge: 2018-05-17 | Disposition: A | Discharge status: Home / Self Care | Payer: Medicare Other | Attending: Emergency Medicine | Admitting: Emergency Medicine

## 2018-05-17 ENCOUNTER — Encounter (HOSPITAL_COMMUNITY): Payer: Self-pay | Admitting: Emergency Medicine

## 2018-05-17 ENCOUNTER — Emergency Department (HOSPITAL_COMMUNITY): Payer: Medicare Other

## 2018-05-17 DIAGNOSIS — E119 Type 2 diabetes mellitus without complications: Secondary | ICD-10-CM | POA: Diagnosis not present

## 2018-05-17 DIAGNOSIS — I10 Essential (primary) hypertension: Secondary | ICD-10-CM | POA: Insufficient documentation

## 2018-05-17 DIAGNOSIS — Z7982 Long term (current) use of aspirin: Secondary | ICD-10-CM | POA: Diagnosis not present

## 2018-05-17 DIAGNOSIS — Z87891 Personal history of nicotine dependence: Secondary | ICD-10-CM | POA: Insufficient documentation

## 2018-05-17 DIAGNOSIS — Z79899 Other long term (current) drug therapy: Secondary | ICD-10-CM | POA: Insufficient documentation

## 2018-05-17 DIAGNOSIS — R0600 Dyspnea, unspecified: Secondary | ICD-10-CM

## 2018-05-17 DIAGNOSIS — J181 Lobar pneumonia, unspecified organism: Secondary | ICD-10-CM | POA: Diagnosis not present

## 2018-05-17 DIAGNOSIS — Z794 Long term (current) use of insulin: Secondary | ICD-10-CM | POA: Insufficient documentation

## 2018-05-17 DIAGNOSIS — R0602 Shortness of breath: Secondary | ICD-10-CM | POA: Diagnosis present

## 2018-05-17 LAB — BASIC METABOLIC PANEL
Anion gap: 17 — ABNORMAL HIGH (ref 5–15)
BUN: 8 mg/dL (ref 6–20)
CO2: 19 mmol/L — ABNORMAL LOW (ref 22–32)
Calcium: 9.1 mg/dL (ref 8.9–10.3)
Chloride: 101 mmol/L (ref 98–111)
Creatinine, Ser: 0.94 mg/dL (ref 0.44–1.00)
GFR calc Af Amer: >60 mL/min (ref >60)
GFR calc non Af Amer: >60 mL/min (ref >60)
Glucose, Bld: 293 mg/dL — ABNORMAL HIGH (ref 70–99)
Potassium: 3.8 mmol/L (ref 3.5–5.1)
Sodium: 137 mmol/L (ref 135–145)

## 2018-05-17 LAB — CBC
HCT: 41.6 % (ref 36.0–46.0)
Hemoglobin: 13.5 g/dL (ref 12.0–15.0)
MCH: 30.4 pg (ref 26.0–34.0)
MCHC: 32.5 g/dL (ref 30.0–36.0)
MCV: 93.7 fL (ref 80.0–100.0)
Platelets: 354 10*3/uL (ref 150–400)
RBC: 4.44 MIL/uL (ref 3.87–5.11)
RDW: 11.8 % (ref 11.5–15.5)
WBC: 7.8 10*3/uL (ref 4.0–10.5)
nRBC: 0 % (ref 0.0–0.2)

## 2018-05-17 LAB — I-STAT TROPONIN, ED: Troponin i, poc: 0.02 ng/mL (ref 0.00–0.08)

## 2018-05-17 MED ORDER — SODIUM CHLORIDE 0.9% FLUSH: 3.0000 mL | Freq: Once | INTRAVENOUS | Status: DC

## 2018-05-17 MED ORDER — CEFPODOXIME PROXETIL 200 MG PO TABS: 200.0000 mg | ORAL_TABLET | Freq: Two times a day (BID) | ORAL | 0 refills | Status: AC

## 2018-05-17 MED ORDER — AZITHROMYCIN 500 MG PO TABS: 500.0000 mg | ORAL_TABLET | Freq: Every day | ORAL | 0 refills | Status: AC

## 2018-05-17 MED ORDER — GUAIFENESIN ER 600 MG PO TB12: 1200.0000 mg | ORAL_TABLET | Freq: Two times a day (BID) | ORAL | Status: DC

## 2018-05-17 MED ORDER — AZITHROMYCIN 500 MG PO TABS: 500.0000 mg | ORAL_TABLET | Freq: Every day | ORAL | 0 refills | Status: DC

## 2018-05-17 MED ORDER — GUAIFENESIN ER 600 MG PO TB12: 1200.0000 mg | ORAL_TABLET | Freq: Two times a day (BID) | ORAL | 0 refills | Status: DC

## 2018-05-17 MED ORDER — GUAIFENESIN ER 600 MG PO TB12: 600.0000 mg | ORAL_TABLET | Freq: Two times a day (BID) | ORAL | 2 refills | Status: DC

## 2018-05-17 MED ORDER — CEFUROXIME AXETIL 250 MG PO TABS: 250.0000 mg | ORAL_TABLET | Freq: Two times a day (BID) | ORAL | 0 refills | Status: DC

## 2018-05-17 NOTE — ED Provider Notes (Addendum)
[facility] EMERGENCY DEPARTMENT Provider Note   CSN: [id number] Arrival date & time: 05/17/18  1226     History   Chief Complaint Chief Complaint  Patient presents with  . Shortness of Breath    HPI Alexandra Elliott is a 48 y.o. female.  48 year old female with prior medical history as detailed below presents for evaluation of worsening shortness of breath.  Patient reports recent admission earlier this month for community-acquired pneumonia.  Patient was discharged on 12.  Patient reports that she was improved initially following discharge.  She returns today complaining of increasing shortness of breath and cough.  This is gotten gradually worse over the last 48 hours.  She also reports elevated sugars at home.  She reports a blood glucose of 600 earlier today.  She denies fever.  She reports full compliance with medications.  The history is provided by the patient and medical records.  Shortness of Breath  Severity:  Moderate Onset quality:  Gradual Duration:  2 days Timing:  Constant Progression:  Worsening Chronicity:  New Context: activity   Relieved by:  Nothing Worsened by:  Nothing   Past Medical History:  Diagnosis Date  . Diabetes mellitus without complication (HCC)   . GERD (gastroesophageal reflux disease)   . Hypertension     Patient Active Problem List   Diagnosis Date Noted  . Sepsis (HCC) 05/11/2018  . Lobar pneumonia (HCC) 05/11/2018  . Acute respiratory failure with hypoxia (HCC) 05/11/2018  . Hypoglycemia 05/11/2018  . Diabetes mellitus without complication (HCC)   . GERD (gastroesophageal reflux disease)   . Hypertension     Past Surgical History:  Procedure Laterality Date  . DILATION AND CURETTAGE OF UTERUS       OB History   No obstetric history on file.      Home Medications    Prior to Admission medications   Medication Sig Start Date End Date Taking? Authorizing Provider  albuterol (PROVENTIL  HFA;VENTOLIN HFA) 108 (90 Base) MCG/ACT inhaler Inhale 2 puffs into the lungs every 4 (four) hours as needed for wheezing or shortness of breath. 05/12/18   Clydie Braun, MD  amLODipine (NORVASC) 10 MG tablet Take 10 mg by mouth daily.    [provider]  aspirin EC 81 MG EC tablet Take 1 tablet (81 mg total) by mouth daily. 05/13/18   Clydie Braun, MD  doxycycline (VIBRAMYCIN) 100 MG capsule Take 1 capsule (100 mg total) by mouth 2 (two) times daily. 05/12/18   Clydie Braun, MD  fenofibrate 160 MG tablet Take 160 mg by mouth daily.    [provider]  ferrous sulfate 325 (65 FE) MG EC tablet Take 325 mg by mouth daily.    [provider]  furosemide (LASIX) 20 MG tablet Take 1 tablet (20 mg total) by mouth daily. 05/12/18 05/12/19  Clydie Braun, MD  guaiFENesin (MUCINEX) 600 MG 12 hr tablet Take 1 tablet (600 mg total) by mouth 2 (two) times daily. 05/12/18   Clydie Braun, MD  hydrOXYzine (ATARAX/VISTARIL) 25 MG tablet Take 25 mg by mouth 3 (three) times daily.    [provider]  Insulin Glargine, 2 Unit Dial, (TOUJEO MAX SOLOSTAR) 300 UNIT/ML SOPN Inject 40 Units into the skin daily. 05/12/18   Clydie Braun, MD  insulin lispro (HUMALOG KWIKPEN) 100 UNIT/ML KwikPen Inject 0.25 mLs (25 Units total) into the skin See admin instructions. Inject 25 units with each meal, and 10 units with  a snack at night. 05/12/18   Clydie BraunSmith, Rondell A, MD  losartan (COZAAR) 25 MG tablet Take 1 tablet (25 mg total) by mouth daily. 05/12/18   Clydie BraunSmith, Rondell A, MD  lovastatin (MEVACOR) 40 MG tablet Take 40 mg by mouth at bedtime.    [provider]  pantoprazole (PROTONIX) 40 MG tablet Take 40 mg by mouth daily.    [provider]    Family History Family History  Problem Relation Age of Onset  . Diabetes Mellitus II Mother   . Diabetes Mellitus II Father   . Diabetes Mellitus I Brother   . Kidney disease Brother     Social History Social History    Tobacco Use  . Smoking status: Former Games developermoker  . Smokeless tobacco: Never Used  Substance Use Topics  . Alcohol use: Never    Frequency: Never  . Drug use: Never     Allergies   Levofloxacin and Lisinopril   Review of Systems Review of Systems  Respiratory: Positive for shortness of breath.   All other systems reviewed and are negative.    Physical Exam Updated Vital Signs BP (!) 162/95 (BP Location: Right Arm)   Pulse (!) 115   Temp 97.8 F (36.6 C) (Oral)   Resp (!) 23   SpO2 98%   Physical Exam Vitals signs and nursing note reviewed.  Constitutional:      General: She is not in acute distress.    Appearance: She is well-developed.  HENT:     Head: Normocephalic and atraumatic.  Eyes:     Conjunctiva/sclera: Conjunctivae normal.     Pupils: Pupils are equal, round, and reactive to light.  Neck:     Musculoskeletal: Normal range of motion and neck supple.  Cardiovascular:     Rate and Rhythm: Normal rate and regular rhythm.     Heart sounds: Normal heart sounds.  Pulmonary:     Effort: Pulmonary effort is normal. No respiratory distress.     Breath sounds: Examination of the right-lower field reveals decreased breath sounds. Examination of the left-lower field reveals decreased breath sounds. Decreased breath sounds present.  Abdominal:     General: There is no distension.     Palpations: Abdomen is soft.     Tenderness: There is no abdominal tenderness.  Musculoskeletal: Normal range of motion.        General: No deformity.  Skin:    General: Skin is warm and dry.  Neurological:     Mental Status: She is alert and oriented to person, place, and time.      ED Treatments / Results  Labs (all labs ordered are listed, but only abnormal results are displayed) Labs Reviewed  BASIC METABOLIC PANEL - Abnormal; Notable for the following components:      Result Value   CO2 19 (*)    Glucose, Bld 293 (*)    Anion gap 17 (*)    All other components  within normal limits  CULTURE, BLOOD (ROUTINE X 2)  CULTURE, BLOOD (ROUTINE X 2)  CBC  BRAIN NATRIURETIC PEPTIDE  I-STAT TROPONIN, ED    EKG None  Radiology Dg Chest 2 View  Result Date: 05/17/2018 CLINICAL DATA:  Severe shortness of breath today, pneumonia, diabetes mellitus, hypertension, GERD EXAM: CHEST - 2 VIEW COMPARISON:  05/10/2018 FINDINGS: Upper normal size of cardiac silhouette. Tortuous aorta. Mediastinal contours and pulmonary vascularity otherwise normal. Minimal improvement in LEFT lung infiltrates though significant infiltrate persists particularly posteriorly on lateral view.  RIGHT lung clear. No pleural effusion or pneumothorax. IMPRESSION: Persistent LEFT lung infiltrates consistent with pneumonia. Continued follow-up until resolution recommended to exclude underlying abnormalities including tumor. Electronically Signed   By: Ulyses SouthwardMark  Boles M.D.   On: 05/17/2018 13:00    Procedures Procedures (including critical care time)  Medications Ordered in ED Medications  sodium chloride flush (NS) 0.9 % injection 3 mL (has no administration in time range)  guaiFENesin (MUCINEX) 12 hr tablet 1,200 mg (has no administration in time range)     Initial Impression / Assessment and Plan / ED Course  I have reviewed the triage vital signs and the nursing notes.  Pertinent labs & imaging results that were available during my care of the patient were reviewed by me and considered in my medical decision making (see chart for details).     MDM  Screen complete   Patient is presenting for persistent shortness of breath following recent admission for treatment of pneumonia.  Patient reports full compliance with outpatient antibiotics.  She does report worsening symptoms over the last 48 hours with significantly elevated sugars at home.  Of note, patient had CTA earlier this month with no evidence of PE found.  Screening labs on the whole today are without significant  abnormality.  Patient's symptoms are concerning for perhaps resistant pneumonia.  Case discussed with hospitalist service who will see the patient and evaluate for possible admission.  1715 -Patient seen by the hospitalist service.  Patient does not meet criteria for admission.  Patient is to be discharged.    Final Clinical Impressions(s) / ED Diagnoses   Final diagnoses:  Dyspnea, unspecified type    ED Discharge Orders    None       Wynetta FinesMessick, Peter C, MD 05/17/18 1620    Wynetta FinesMessick, Peter C, MD 05/17/18 1715

## 2018-05-17 NOTE — Progress Notes (Signed)
Went to pt's room to instruct on the use of flutter valve and incentive before discharge.  Pt refused and left.

## 2018-05-17 NOTE — Consult Note (Signed)
Patient Demographics  Alexandra Elliott, is a 48 y.o. female   MRN: [id number]   DOB - 1970-06-14  Admit Date - 05/17/2018    Outpatient Primary MD for the patient is Wilburn Mylar, MD  Consult requested in the Hospital by Wynetta Fines, MD, On 05/17/2018    Reason for consult : Evaluation for need of admission   With History of -  Past Medical History:  Diagnosis Date  . Diabetes mellitus without complication (HCC)   . GERD (gastroesophageal reflux disease)   . Hypertension       Past Surgical History:  Procedure Laterality Date  . DILATION AND CURETTAGE OF UTERUS      in for   Chief Complaint  Patient presents with  . Shortness of Breath     HPI  Alexandra Elliott  is a 48 y.o. female, has medical history of diabetes mellitus, hypertension, GERD, with recent hospitalization from January 10-12 for community-acquired pneumonia, imaging during that admission significant for left lobar pneumonia, respiratory panel was negative during that admission, she was treated with IV Rocephin and azithromycin during hospital stay, discharged on doxycycline, at home she reported initial improvement, but she reports cough and shortness of breath over the last 1 to 2 days, so she came back to ED, in ED she was saturating 100% on room air, afebrile, no leukocytosis, appears comfortable with no increased work of breathing, no wheezing, 2 view chest x-ray significant for left lung opacity, I was called by ED to evaluate for need of admission.    Review of Systems    In addition to the HPI above,  No Fever-chills No Headache, No changes with Vision or hearing, No problems swallowing food or Liquids, No Chest pain, reports cough, nonproductive No Abdominal pain, No Nausea or Vommitting, Bowel movements are regular, No Blood in stool or Urine, No dysuria, No new skin rashes or bruises, No new joints  pains-aches,  No new weakness, tingling, numbness in any extremity, No recent weight gain or loss, No polyuria, polydypsia or polyphagia, No significant Mental Stressors.  A full 10 point Review of Systems was done, except as stated above, all other Review of Systems were negative.   Social History Social History   Tobacco Use  . Smoking status: Former Games developer  . Smokeless tobacco: Never Used  Substance Use Topics  . Alcohol use: Never    Frequency: Never     Family History Family History  Problem Relation Age of Onset  . Diabetes Mellitus II Mother   . Diabetes Mellitus II Father   . Diabetes Mellitus I Brother   . Kidney disease Brother     Prior to Admission medications   Medication Sig Start Date End Date Taking? Authorizing Provider  albuterol (PROVENTIL HFA;VENTOLIN HFA) 108 (90 Base) MCG/ACT inhaler Inhale 2 puffs into the lungs every 4 (four) hours as needed for wheezing or shortness of breath. 05/12/18   Clydie Braun, MD  amLODipine (NORVASC) 10 MG tablet Take 10  mg by mouth daily.    [provider]  aspirin EC 81 MG EC tablet Take 1 tablet (81 mg total) by mouth daily. 05/13/18   Clydie BraunSmith, Rondell A, MD  azithromycin (ZITHROMAX) 500 MG tablet Take 1 tablet (500 mg total) by mouth daily for 5 doses. Take 1 tablet daily for 3 days. 05/17/18 05/22/18  Eulalio Reamy, Leana Roeawood S, MD  cefpodoxime (VANTIN) 200 MG tablet Take 1 tablet (200 mg total) by mouth 2 (two) times daily for 5 days. 05/17/18 05/22/18  Leeroy Lovings, Leana Roeawood S, MD  fenofibrate 160 MG tablet Take 160 mg by mouth daily.    [provider]  ferrous sulfate 325 (65 FE) MG EC tablet Take 325 mg by mouth daily.    [provider]  furosemide (LASIX) 20 MG tablet Take 1 tablet (20 mg total) by mouth daily. 05/12/18 05/12/19  Clydie BraunSmith, Rondell A, MD  guaiFENesin (MUCINEX) 600 MG 12 hr tablet Take 1 tablet (600 mg total) by mouth 2 (two) times daily. 05/17/18 05/17/19  Dionne Rossa, Leana Roeawood S, MD  guaiFENesin  (MUCINEX) 600 MG 12 hr tablet Take 2 tablets (1,200 mg total) by mouth 2 (two) times daily. 05/17/18   Naji Mehringer, Leana Roeawood S, MD  hydrOXYzine (ATARAX/VISTARIL) 25 MG tablet Take 25 mg by mouth 3 (three) times daily.    [provider]  Insulin Glargine, 2 Unit Dial, (TOUJEO MAX SOLOSTAR) 300 UNIT/ML SOPN Inject 40 Units into the skin daily. 05/12/18   Clydie BraunSmith, Rondell A, MD  insulin lispro (HUMALOG KWIKPEN) 100 UNIT/ML KwikPen Inject 0.25 mLs (25 Units total) into the skin See admin instructions. Inject 25 units with each meal, and 10 units with a snack at night. 05/12/18   Clydie BraunSmith, Rondell A, MD  losartan (COZAAR) 25 MG tablet Take 1 tablet (25 mg total) by mouth daily. 05/12/18   Clydie BraunSmith, Rondell A, MD  lovastatin (MEVACOR) 40 MG tablet Take 40 mg by mouth at bedtime.    [provider]  pantoprazole (PROTONIX) 40 MG tablet Take 40 mg by mouth daily.    [provider]    Anti-infectives (From admission, onward)   Start     Dose/Rate Route Frequency Ordered Stop   05/17/18 0000  azithromycin (ZITHROMAX) 500 MG tablet  Status:  Discontinued     500 mg Oral Daily 05/17/18 1641 05/17/18    05/17/18 0000  cefUROXime (CEFTIN) 250 MG tablet  Status:  Discontinued     250 mg Oral 2 times daily with meals 05/17/18 1641 05/17/18    05/17/18 0000  cefpodoxime (VANTIN) 200 MG tablet     200 mg Oral 2 times daily 05/17/18 1648 05/22/18 2359   05/17/18 0000  azithromycin (ZITHROMAX) 500 MG tablet     500 mg Oral Daily 05/17/18 1648 05/22/18 2359      Scheduled Meds: . guaiFENesin  1,200 mg Oral BID  . sodium chloride flush  3 mL Intravenous Once   Continuous Infusions: PRN Meds:.  Allergies  Allergen Reactions  . Levofloxacin Other (See Comments)    Loss of hearing  . Lisinopril Swelling    Had lip swelling but determined not to be related to medication    Physical Exam  Vitals  Blood pressure (!) 162/95, pulse (!) 115, temperature 97.8 F (36.6 C), temperature source  Oral, resp. rate (!) 23, SpO2 98 %.   1. General obese female, sitting in bed in no apparent distress*  2. Normal affect and insight, Not Suicidal or Homicidal, Awake Alert, Oriented X 3.  3. No F.N deficits, ALL C.Nerves Intact, Strength 5/5 all 4 extremities, Sensation intact all 4 extremities, Plantars down going.  4. Ears and Eyes appear Normal, Conjunctivae clear, PERRLA. Moist Oral Mucosa.  5. Supple Neck, No JVD, No cervical lymphadenopathy appriciated, No Carotid Bruits.  6. Symmetrical Chest wall movement, Good air movement bilaterally, CTAB.  7.  Mildly tachycardic, No Gallops, Rubs or Murmurs, No Parasternal Heave, trace pedal edema  8. Positive Bowel Sounds, Abdomen Soft, No tenderness, No organomegaly appriciated,No rebound -guarding or rigidity.  9.  No Cyanosis, Normal Skin Turgor, No Skin Rash or Bruise.  10. Good muscle tone,  joints appear normal , no effusions, Normal ROM.  11. No Palpable Lymph Nodes in Neck or Axillae   Data Review  CBC Recent Labs  Lab 05/10/18 2141 05/11/18 0633 05/17/18 1237  WBC 8.4 6.6 7.8  HGB 13.7 12.8 13.5  HCT 43.2 40.1 41.6  PLT 378 385 354  MCV 92.9 91.6 93.7  MCH 29.5 29.2 30.4  MCHC 31.7 31.9 32.5  RDW 11.9 11.8 11.8  LYMPHSABS 3.3 1.8  --   MONOABS 1.0 0.5  --   EOSABS 0.3 0.2  --   BASOSABS 0.1 0.1  --    ------------------------------------------------------------------------------------------------------------------  Chemistries  Recent Labs  Lab 05/10/18 2141 05/11/18 0633 05/17/18 1237  NA 137 136 137  K 4.6 3.6 3.8  CL 101 102 101  CO2 26 23 19*  GLUCOSE 153* 93 293*  BUN 7 8 8   CREATININE 0.79 0.82 0.94  CALCIUM 9.3 9.3 9.1  AST 30  --   --   ALT 19  --   --   ALKPHOS 58  --   --   BILITOT 1.3*  --   --    ------------------------------------------------------------------------------------------------------------------ estimated creatinine clearance is 105.6 mL/min (by C-G formula based on  SCr of 0.94 mg/dL). ------------------------------------------------------------------------------------------------------------------ No results for input(s): TSH, T4TOTAL, T3FREE, THYROIDAB in the last 72 hours.  Invalid input(s): FREET3   Coagulation profile No results for input(s): INR, PROTIME in the last 168 hours. ------------------------------------------------------------------------------------------------------------------- No results for input(s): DDIMER in the last 72 hours. -------------------------------------------------------------------------------------------------------------------  Cardiac Enzymes No results for input(s): CKMB, TROPONINI, MYOGLOBIN in the last 168 hours.  Invalid input(s): CK ------------------------------------------------------------------------------------------------------------------ Invalid input(s): POCBNP   ---------------------------------------------------------------------------------------------------------------  Urinalysis No results found for: COLORURINE, APPEARANCEUR, LABSPEC, PHURINE, GLUCOSEU, HGBUR, BILIRUBINUR, KETONESUR, PROTEINUR, UROBILINOGEN, NITRITE, LEUKOCYTESUR   Imaging results:   Dg Chest 2 View  Result Date: 05/17/2018 CLINICAL DATA:  Severe shortness of breath today, pneumonia, diabetes mellitus, hypertension, GERD EXAM: CHEST - 2 VIEW COMPARISON:  05/10/2018 FINDINGS: Upper normal size of cardiac silhouette. Tortuous aorta. Mediastinal contours and pulmonary vascularity otherwise normal. Minimal improvement in LEFT lung infiltrates though significant infiltrate persists particularly posteriorly on lateral view. RIGHT lung clear. No pleural effusion or pneumothorax. IMPRESSION: Persistent LEFT lung infiltrates consistent with pneumonia. Continued follow-up until resolution recommended to exclude underlying abnormalities including tumor. Electronically Signed   By: Ulyses Southward M.D.   On: 05/17/2018 13:00      Assessment & Plan  Active Problems:   * No active hospital problems. *   Community-acquired pneumonia -Patient with recent hospitalization, for community-acquired pneumonia, left lung base, she was treated with IV Rocephin azithromycin during hospital stay, discharged on doxycycline, reports some initial improvement, came back as she still feeling sick, and uncontrolled blood sugars. -X-ray in ED significant for left lung pneumonia, I would anticipate it will take 3 to 4 weeks before ammonia clears from  her x-ray . -No fever, no hypoxia, no signs of sepsis, patient can be discharged from ED on 5 days of oral azithromycin, and Vantin, she was given prescription, she was instructed to continue using her Mucinex. -Patient will be given incentive spirometry, and flutter valve, in ED, and she will be instructed to continue using as outpatient. -Patient will need repeat two-view chest x-ray in 3 to 4 weeks to ensure resolution of her pneumonia.  Diabetes mellitus, type II insulin-dependent, uncontrolled -CBGs are uncontrolled, even during recent hospitalization her CBGs were in the 400s range, her A1c was noted to be 8.9, I have instructed her to increase her glargine to 50 units over the next couple days, then to go back to her normal dose of 40 units of glargine    Patient can be discharged from ED with recommendation to continue azithromycin and Vantin for next 5 days, follow-up with PCP within 1 week, will need repeat two-view chest x-ray in 3 to 4 weeks to ensure resolution of left lobar pneumonia.   I will forward this consult note to her PCP [facility]   Thank you for the consult,    Huey BienenstockDawood Nickole Adamek M.D on 05/17/2018 at 4:48 PM  Between 7am to 7pm - Pager - [phone]  After 7pm go to [email or web address] - password [email or web address]   Thank you for the consult, we will follow the patient with you in the Hospital.   [facility]   Office  [phone]

## 2018-05-17 NOTE — ED Notes (Signed)
Pt elected to ambulate to the bathroom prior to triage

## 2018-05-17 NOTE — Discharge Instructions (Addendum)
Return for any problem.  Follow-up with your regular care providers as instructed. 

## 2018-05-17 NOTE — ED Triage Notes (Signed)
Pt states she was recently admitted for pneumonia and discharge on 12th per pt. Pt states she is no better still still coughing and has sob with walking.

## 2018-05-22 NOTE — Congregational Nurse Program (Signed)
  Dept: [phone]   Congregational Nurse Program Note  Date of Encounter: 05/22/2018  Past Medical History: Past Medical History:  Diagnosis Date  . Diabetes mellitus without complication (HCC)   . GERD (gastroesophageal reflux disease)   . Hypertension     Encounter Details: CNP Questionnaire - 05/22/18 1745      Questionnaire   Patient Status  Not Applicable    Race  Black or African American    [facility]    Insurance  Private Insurance    Uninsured  Not Applicable    Food  No food insecurities    Housing/Utilities  No permanent housing    Transportation  Yes, need transportation assistance    Interpersonal Safety  Yes, feel physically and emotionally safe where you currently live    Medication  Yes, have medication insecurities    Medical Provider  Yes    Referrals  Area Agency    ED Visit Averted  Not Applicable    Life-Saving Intervention Made  Not Applicable      [facility]. She is here with her   son and significant other. Her  concerns  are  Her about her insulin and housing. She was in the hospital and the [facility]. I asked her if she would call and see if she could get her medications. If not  We will see what we can do to help. She has in surance and can get them. Left in a hurry from being put out of her residence. She has enough of her other medications. Will check about her insulin. SWS to help also with housing. [id number]Aquiles Ruffini RN BSN [facility] CN PhD. [phone]

## 2018-05-23 ENCOUNTER — Other Ambulatory Visit: Payer: Self-pay

## 2018-05-23 ENCOUNTER — Emergency Department (HOSPITAL_COMMUNITY): Payer: Medicare Other

## 2018-05-23 ENCOUNTER — Encounter (HOSPITAL_COMMUNITY): Payer: Self-pay

## 2018-05-23 ENCOUNTER — Observation Stay (HOSPITAL_COMMUNITY)
Admission: EM | Admit: 2018-05-23 | Discharge: 2018-05-25 | Disposition: A | Discharge status: Home / Self Care | Payer: Medicare Other | Attending: Internal Medicine | Admitting: Internal Medicine

## 2018-05-23 DIAGNOSIS — Z888 Allergy status to other drugs, medicaments and biological substances status: Secondary | ICD-10-CM | POA: Diagnosis not present

## 2018-05-23 DIAGNOSIS — Z794 Long term (current) use of insulin: Secondary | ICD-10-CM | POA: Diagnosis not present

## 2018-05-23 DIAGNOSIS — E1110 Type 2 diabetes mellitus with ketoacidosis without coma: Secondary | ICD-10-CM | POA: Diagnosis present

## 2018-05-23 DIAGNOSIS — E785 Hyperlipidemia, unspecified: Secondary | ICD-10-CM | POA: Diagnosis not present

## 2018-05-23 DIAGNOSIS — D509 Iron deficiency anemia, unspecified: Secondary | ICD-10-CM | POA: Diagnosis not present

## 2018-05-23 DIAGNOSIS — R600 Localized edema: Secondary | ICD-10-CM | POA: Diagnosis not present

## 2018-05-23 DIAGNOSIS — Z833 Family history of diabetes mellitus: Secondary | ICD-10-CM | POA: Diagnosis not present

## 2018-05-23 DIAGNOSIS — Z79899 Other long term (current) drug therapy: Secondary | ICD-10-CM | POA: Diagnosis not present

## 2018-05-23 DIAGNOSIS — E1010 Type 1 diabetes mellitus with ketoacidosis without coma: Principal | ICD-10-CM | POA: Insufficient documentation

## 2018-05-23 DIAGNOSIS — R109 Unspecified abdominal pain: Secondary | ICD-10-CM | POA: Diagnosis present

## 2018-05-23 DIAGNOSIS — Z7982 Long term (current) use of aspirin: Secondary | ICD-10-CM | POA: Diagnosis not present

## 2018-05-23 DIAGNOSIS — R739 Hyperglycemia, unspecified: Secondary | ICD-10-CM

## 2018-05-23 DIAGNOSIS — R112 Nausea with vomiting, unspecified: Secondary | ICD-10-CM | POA: Diagnosis not present

## 2018-05-23 DIAGNOSIS — E119 Type 2 diabetes mellitus without complications: Secondary | ICD-10-CM

## 2018-05-23 DIAGNOSIS — K219 Gastro-esophageal reflux disease without esophagitis: Secondary | ICD-10-CM | POA: Diagnosis not present

## 2018-05-23 DIAGNOSIS — I10 Essential (primary) hypertension: Secondary | ICD-10-CM | POA: Diagnosis present

## 2018-05-23 DIAGNOSIS — E860 Dehydration: Secondary | ICD-10-CM | POA: Insufficient documentation

## 2018-05-23 HISTORY — DX: Hyperlipidemia, unspecified: E78.5

## 2018-05-23 HISTORY — DX: Pure hypercholesterolemia, unspecified: E78.00

## 2018-05-23 LAB — CBC WITH DIFFERENTIAL/PLATELET
ABS IMMATURE GRANULOCYTES: 0.01 10*3/uL (ref 0.00–0.07)
Basophils Absolute: 0.1 10*3/uL (ref 0.0–0.1)
Basophils Relative: 1 %
Eosinophils Absolute: 0.1 10*3/uL (ref 0.0–0.5)
Eosinophils Relative: 2 %
HCT: 46 % (ref 36.0–46.0)
Hemoglobin: 15.1 g/dL — ABNORMAL HIGH (ref 12.0–15.0)
Immature Granulocytes: 0 %
LYMPHS PCT: 31 %
Lymphs Abs: 2.4 10*3/uL (ref 0.7–4.0)
MCH: 30.4 pg (ref 26.0–34.0)
MCHC: 32.8 g/dL (ref 30.0–36.0)
MCV: 92.6 fL (ref 80.0–100.0)
Monocytes Absolute: 0.9 10*3/uL (ref 0.1–1.0)
Monocytes Relative: 11 %
Neutro Abs: 4.2 10*3/uL (ref 1.7–7.7)
Neutrophils Relative %: 55 %
Platelets: 299 10*3/uL (ref 150–400)
RBC: 4.97 MIL/uL (ref 3.87–5.11)
RDW: 12.2 % (ref 11.5–15.5)
WBC: 7.7 10*3/uL (ref 4.0–10.5)
nRBC: 0 % (ref 0.0–0.2)

## 2018-05-23 LAB — BLOOD GAS, VENOUS
Acid-base deficit: 9.9 mmol/L — ABNORMAL HIGH (ref 0.0–2.0)
Bicarbonate: 15.8 mmol/L — ABNORMAL LOW (ref 20.0–28.0)
FIO2: 21
O2 SAT: 74.2 %
Patient temperature: 98.6
pCO2, Ven: 35.6 mmHg — ABNORMAL LOW (ref 44.0–60.0)
pH, Ven: 7.271 (ref 7.250–7.430)
pO2, Ven: 44 mmHg (ref 32.0–45.0)

## 2018-05-23 LAB — COMPREHENSIVE METABOLIC PANEL
ALT: 24 U/L (ref 0–44)
AST: 15 U/L (ref 15–41)
Albumin: 4.2 g/dL (ref 3.5–5.0)
Alkaline Phosphatase: 83 U/L (ref 38–126)
Anion gap: 18 — ABNORMAL HIGH (ref 5–15)
BUN: 13 mg/dL (ref 6–20)
CO2: 15 mmol/L — ABNORMAL LOW (ref 22–32)
Calcium: 10.1 mg/dL (ref 8.9–10.3)
Chloride: 100 mmol/L (ref 98–111)
Creatinine, Ser: 0.98 mg/dL (ref 0.44–1.00)
GFR calc Af Amer: >60 mL/min (ref >60)
GFR calc non Af Amer: >60 mL/min (ref >60)
Glucose, Bld: 556 mg/dL (ref 70–99)
Potassium: 4.4 mmol/L (ref 3.5–5.1)
Sodium: 133 mmol/L — ABNORMAL LOW (ref 135–145)
TOTAL PROTEIN: 8.5 g/dL — AB (ref 6.5–8.1)
Total Bilirubin: 1.7 mg/dL — ABNORMAL HIGH (ref 0.3–1.2)

## 2018-05-23 LAB — MAGNESIUM: Magnesium: 1.9 mg/dL (ref 1.7–2.4)

## 2018-05-23 LAB — URINALYSIS, ROUTINE W REFLEX MICROSCOPIC
Bilirubin Urine: NEGATIVE
Glucose, UA: >=500 mg/dL — AB
HGB URINE DIPSTICK: NEGATIVE
Ketones, ur: 80 mg/dL — AB
LEUKOCYTES UA: NEGATIVE
Nitrite: NEGATIVE
Protein, ur: NEGATIVE mg/dL
Specific Gravity, Urine: 1.032 — ABNORMAL HIGH (ref 1.005–1.030)
pH: 5 (ref 5.0–8.0)

## 2018-05-23 LAB — GLUCOSE, CAPILLARY
GLUCOSE-CAPILLARY: 188 mg/dL — AB (ref 70–99)
Glucose-Capillary: 173 mg/dL — ABNORMAL HIGH (ref 70–99)
Glucose-Capillary: 150 mg/dL — ABNORMAL HIGH (ref 70–99)
Glucose-Capillary: 229 mg/dL — ABNORMAL HIGH (ref 70–99)
Glucose-Capillary: 156 mg/dL — ABNORMAL HIGH (ref 70–99)
Glucose-Capillary: 133 mg/dL — ABNORMAL HIGH (ref 70–99)

## 2018-05-23 LAB — BASIC METABOLIC PANEL
Anion gap: 10 (ref 5–15)
Anion gap: 6 (ref 5–15)
CO2: 20 mmol/L — ABNORMAL LOW (ref 22–32)
Calcium: 9 mg/dL (ref 8.9–10.3)
Calcium: 8.7 mg/dL — ABNORMAL LOW (ref 8.9–10.3)
Chloride: 111 mmol/L (ref 98–111)
Chloride: 111 mmol/L (ref 98–111)
Creatinine, Ser: 0.7 mg/dL (ref 0.44–1.00)
Creatinine, Ser: 0.59 mg/dL (ref 0.44–1.00)
GFR calc Af Amer: >60 mL/min (ref >60)
GFR calc Af Amer: >60 mL/min (ref >60)
GFR calc non Af Amer: >60 mL/min (ref >60)
GFR calc non Af Amer: >60 mL/min (ref >60)
Glucose, Bld: 155 mg/dL — ABNORMAL HIGH (ref 70–99)
Glucose, Bld: 142 mg/dL — ABNORMAL HIGH (ref 70–99)
Potassium: 3.8 mmol/L (ref 3.5–5.1)
Potassium: 3.2 mmol/L — ABNORMAL LOW (ref 3.5–5.1)
Sodium: 140 mmol/L (ref 135–145)
Sodium: 137 mmol/L (ref 135–145)

## 2018-05-23 LAB — CBG MONITORING, ED
Glucose-Capillary: 493 mg/dL — ABNORMAL HIGH (ref 70–99)
Glucose-Capillary: 415 mg/dL — ABNORMAL HIGH (ref 70–99)
Glucose-Capillary: 430 mg/dL — ABNORMAL HIGH (ref 70–99)
Glucose-Capillary: 232 mg/dL — ABNORMAL HIGH (ref 70–99)
Glucose-Capillary: 209 mg/dL — ABNORMAL HIGH (ref 70–99)

## 2018-05-23 LAB — PHOSPHORUS: Phosphorus: 1.8 mg/dL — ABNORMAL LOW (ref 2.5–4.6)

## 2018-05-23 LAB — BASIC METABOLIC PANEL WITH GFR
BUN: 10 mg/dL (ref 6–20)
BUN: 8 mg/dL (ref 6–20)
CO2: 19 mmol/L — ABNORMAL LOW (ref 22–32)

## 2018-05-23 LAB — MRSA PCR SCREENING: MRSA by PCR: NEGATIVE

## 2018-05-23 LAB — LIPASE, BLOOD: Lipase: 31 U/L (ref 11–51)

## 2018-05-23 MED ORDER — SODIUM CHLORIDE 0.9 % IV SOLN: INTRAVENOUS | Status: AC

## 2018-05-23 MED ORDER — SODIUM CHLORIDE 0.9 % IV BOLUS
1000.0000 mL | Freq: Once | INTRAVENOUS | Status: AC
  Administered 2018-05-23: 1000 mL via INTRAVENOUS

## 2018-05-23 MED ORDER — ENOXAPARIN SODIUM 80 MG/0.8ML ~~LOC~~ SOLN
80.0000 mg | SUBCUTANEOUS | Status: DC
  Administered 2018-05-23: 80 mg via SUBCUTANEOUS
  Filled 2018-05-23: qty 0.8

## 2018-05-23 MED ORDER — TRAMADOL HCL 50 MG PO TABS
50.0000 mg | ORAL_TABLET | Freq: Four times a day (QID) | ORAL | Status: DC | PRN
  Administered 2018-05-24 (×3): 50 mg via ORAL
  Filled 2018-05-23 (×3): qty 1

## 2018-05-23 MED ORDER — ONDANSETRON HCL 4 MG PO TABS: 4.0000 mg | ORAL_TABLET | Freq: Four times a day (QID) | ORAL | Status: DC | PRN

## 2018-05-23 MED ORDER — DEXTROSE-NACL 5-0.45 % IV SOLN
INTRAVENOUS | Status: DC
  Administered 2018-05-23 – 2018-05-24 (×3): via INTRAVENOUS

## 2018-05-23 MED ORDER — POLYETHYLENE GLYCOL 3350 17 G PO PACK
17.0000 g | PACK | Freq: Every day | ORAL | Status: DC | PRN
  Administered 2018-05-24: 17 g via ORAL
  Filled 2018-05-23: qty 1

## 2018-05-23 MED ORDER — ONDANSETRON HCL 4 MG/2ML IJ SOLN
4.0000 mg | Freq: Once | INTRAMUSCULAR | Status: AC
  Administered 2018-05-23: 4 mg via INTRAVENOUS
  Filled 2018-05-23: qty 2

## 2018-05-23 MED ORDER — MORPHINE SULFATE (PF) 4 MG/ML IV SOLN
4.0000 mg | Freq: Once | INTRAVENOUS | Status: AC
  Administered 2018-05-23: 4 mg via INTRAVENOUS
  Filled 2018-05-23: qty 1

## 2018-05-23 MED ORDER — SODIUM CHLORIDE 0.9 % IV SOLN: INTRAVENOUS | Status: DC

## 2018-05-23 MED ORDER — ACETAMINOPHEN 325 MG PO TABS
650.0000 mg | ORAL_TABLET | Freq: Four times a day (QID) | ORAL | Status: DC | PRN
  Administered 2018-05-24: 650 mg via ORAL
  Filled 2018-05-23: qty 2

## 2018-05-23 MED ORDER — ACETAMINOPHEN 650 MG RE SUPP: 650.0000 mg | Freq: Four times a day (QID) | RECTAL | Status: DC | PRN

## 2018-05-23 MED ORDER — SODIUM CHLORIDE 0.9 % IV SOLN
Freq: Once | INTRAVENOUS | Status: AC
  Administered 2018-05-23: 14:00:00 via INTRAVENOUS

## 2018-05-23 MED ORDER — POTASSIUM CHLORIDE 10 MEQ/100ML IV SOLN
10.0000 meq | INTRAVENOUS | Status: AC
  Administered 2018-05-23: 10 meq via INTRAVENOUS
  Filled 2018-05-23: qty 100

## 2018-05-23 MED ORDER — ONDANSETRON HCL 4 MG/2ML IJ SOLN: 4.0000 mg | Freq: Four times a day (QID) | INTRAMUSCULAR | Status: DC | PRN

## 2018-05-23 MED ORDER — INSULIN REGULAR(HUMAN) IN NACL 100-0.9 UT/100ML-% IV SOLN
INTRAVENOUS | Status: DC
  Administered 2018-05-23: 3.6 [IU]/h via INTRAVENOUS
  Filled 2018-05-23: qty 100

## 2018-05-23 MED ORDER — ENOXAPARIN SODIUM 40 MG/0.4ML ~~LOC~~ SOLN: 40.0000 mg | SUBCUTANEOUS | Status: DC

## 2018-05-23 NOTE — ED Notes (Signed)
ED TO INPATIENT HANDOFF REPORT  Name/Age/Gender Alexandra Elliott 48 y.o. female  Code Status Code Status History    Date Active Date Inactive Code Status Order ID Comments User Context   05/11/2018 0305 05/12/2018 2023 Full Code [id number]  Lorretta Harp, MD ED      Home/SNF/Other Home  Chief Complaint DIABETIC NO INSULIN  Level of Care/Admitting Diagnosis ED Disposition    ED Disposition Condition Comment   Admit  Hospital Area: [facility] [[id number]]  Level of Care: Stepdown [14]  Admit to SDU based on following criteria: Severe physiological/psychological symptoms:  Any diagnosis requiring assessment & intervention at least every 4 hours on an ongoing basis to obtain desired patient outcomes including stability and rehabilitation  Diagnosis: DKA (diabetic ketoacidoses) [facility] [[id number]]  Admitting Physician: Delaine Lame [[id number]]  Attending Physician: Delaine Lame [[id number]]  PT Class (Do Not Modify): Observation [104]  PT Acc Code (Do Not Modify): Observation [10022]       Medical History Past Medical History:  Diagnosis Date  . Diabetes mellitus without complication (HCC)   . GERD (gastroesophageal reflux disease)   . High cholesterol   . HLD (hyperlipidemia) 05/23/2018  . Hypertension     Allergies Allergies  Allergen Reactions  . Levofloxacin Other (See Comments)    Loss of hearing  . Lisinopril Swelling    Had lip swelling but determined not to be related to medication    IV Location/Drains/Wounds Patient Lines/Drains/Airways Status   Active Line/Drains/Airways    Name:   Placement date:   Placement time:   Site:   Days:   Peripheral IV 05/23/18 Right Antecubital   05/23/18    1030    Antecubital   less than 1   Peripheral IV 05/23/18 Right Wrist   05/23/18    1324    Wrist   less than 1          Labs/Imaging Results for orders placed or performed during the hospital encounter of 05/23/18 (from the past 48 hour(s))  CBG  monitoring, ED     Status: Abnormal   Collection Time: 05/23/18  9:43 AM  Result Value Ref Range   Glucose-Capillary 493 (H) 70 - 99 mg/dL   Comment 1 Notify RN    Comment 2 Document in Chart   Blood gas, venous     Status: Abnormal   Collection Time: 05/23/18 10:30 AM  Result Value Ref Range   FIO2 21.00    Delivery systems ROOM AIR    pH, Ven 7.271 7.250 - 7.430   pCO2, Ven 35.6 (L) 44.0 - 60.0 mmHg   pO2, Ven 44.0 32.0 - 45.0 mmHg   Bicarbonate 15.8 (L) 20.0 - 28.0 mmol/L   Acid-base deficit 9.9 (H) 0.0 - 2.0 mmol/L   O2 Saturation 74.2 %   Patient temperature 98.6    Collection site VEIN    Drawn by RN    Sample type VEIN     Comment: Performed at [facility], [address], [place], [place] [address]  CBC with Differential     Status: Abnormal   Collection Time: 05/23/18 10:31 AM  Result Value Ref Range   WBC 7.7 4.0 - 10.5 K/uL   RBC 4.97 3.87 - 5.11 MIL/uL   Hemoglobin 15.1 (H) 12.0 - 15.0 g/dL   HCT 75.0 51.8 - 33.5 %   MCV 92.6 80.0 - 100.0 fL   MCH 30.4 26.0 - 34.0 pg   MCHC 32.8 30.0 -  36.0 g/dL   RDW 16.112.2 09.611.5 - 04.515.5 %   Platelets 299 150 - 400 K/uL   nRBC 0.0 0.0 - 0.2 %   Neutrophils Relative % 55 %   Neutro Abs 4.2 1.7 - 7.7 K/uL   Lymphocytes Relative 31 %   Lymphs Abs 2.4 0.7 - 4.0 K/uL   Monocytes Relative 11 %   Monocytes Absolute 0.9 0.1 - 1.0 K/uL   Eosinophils Relative 2 %   Eosinophils Absolute 0.1 0.0 - 0.5 K/uL   Basophils Relative 1 %   Basophils Absolute 0.1 0.0 - 0.1 K/uL   Immature Granulocytes 0 %   Abs Immature Granulocytes 0.01 0.00 - 0.07 K/uL    Comment: Performed at [facility], [address] [place], [place] [address]  Comprehensive metabolic panel     Status: Abnormal   Collection Time: 05/23/18 10:31 AM  Result Value Ref Range   Sodium 133 (L) 135 - 145 mmol/L   Potassium 4.4 3.5 - 5.1 mmol/L   Chloride 100 98 - 111 mmol/L   CO2 15 (L) 22 - 32 mmol/L   Glucose, Bld 556 (HH) 70 - 99  mg/dL    Comment: CRITICAL RESULT CALLED TO, READ BACK BY AND VERIFIED WITH: BINGHAM,S. RN AT 1126 05/23/18 MULLINS,T    BUN 13 6 - 20 mg/dL   Creatinine, Ser 1.910.98 0.44 - 1.00 mg/dL   Calcium 47.810.1 8.9 - 29.510.3 mg/dL   Total Protein 8.5 (H) 6.5 - 8.1 g/dL   Albumin 4.2 3.5 - 5.0 g/dL   AST 15 15 - 41 U/L   ALT 24 0 - 44 U/L   Alkaline Phosphatase 83 38 - 126 U/L   Total Bilirubin 1.7 (H) 0.3 - 1.2 mg/dL   GFR calc non Af Amer >60 >60 mL/min   GFR calc Af Amer >60 >60 mL/min   Anion gap 18 (H) 5 - 15    Comment: Performed at [facility], [address] [place], [place] [address]  Urinalysis, Routine w reflex microscopic     Status: Abnormal   Collection Time: 05/23/18 10:31 AM  Result Value Ref Range   Color, Urine STRAW (A) YELLOW   APPearance CLEAR CLEAR   Specific Gravity, Urine 1.032 (H) 1.005 - 1.030   pH 5.0 5.0 - 8.0   Glucose, UA >=500 (A) NEGATIVE mg/dL   Hgb urine dipstick NEGATIVE NEGATIVE   Bilirubin Urine NEGATIVE NEGATIVE   Ketones, ur 80 (A) NEGATIVE mg/dL   Protein, ur NEGATIVE NEGATIVE mg/dL   Nitrite NEGATIVE NEGATIVE   Leukocytes, UA NEGATIVE NEGATIVE   RBC / HPF 0-5 0 - 5 RBC/hpf   WBC, UA 0-5 0 - 5 WBC/hpf   Bacteria, UA RARE (A) NONE SEEN   Squamous Epithelial / LPF 0-5 0 - 5   Mucus PRESENT     Comment: Performed at [facility], [address] [place], [place] [address]  Lipase, blood     Status: None   Collection Time: 05/23/18 10:31 AM  Result Value Ref Range   Lipase 31 11 - 51 U/L    Comment: Performed at [facility], [address] [place], [place] [address]  CBG monitoring, ED     Status: Abnormal   Collection Time: 05/23/18 12:38 PM  Result Value Ref Range   Glucose-Capillary 415 (H) 70 - 99 mg/dL  CBG monitoring, ED     Status: Abnormal   Collection Time: 05/23/18  1:33 PM  Result Value Ref Range   Glucose-Capillary 430 (H) 70 - 99 mg/dL  CBG monitoring, ED     Status: Abnormal    Collection Time: 05/23/18  2:42 PM  Result Value Ref Range   Glucose-Capillary 232 (H) 70 - 99 mg/dL  CBG monitoring, ED     Status: Abnormal   Collection Time: 05/23/18  3:51 PM  Result Value Ref Range   Glucose-Capillary 209 (H) 70 - 99 mg/dL   Dg Chest 2 View  Result Date: 05/23/2018 CLINICAL DATA:  Shortness of breath EXAM: CHEST - 2 VIEW COMPARISON:  05/17/2018 FINDINGS: Cardiac shadows within normal limits. The lungs are well aerated bilaterally. Persistent left basilar infiltrate is noted. No new focal infiltrate or effusion is seen. Degenerative changes of the thoracic spine are noted. IMPRESSION: Stable left basilar infiltrate. Electronically Signed   By: Alcide Clever M.D.   On: 05/23/2018 12:37    Pending Labs [employer] (From admission, onward)    Start     Ordered   Signed and Interior and spatial designer  Now then every 6 hours,   STAT     Signed and Held   Signed and Held  Magnesium  Once,   R     Signed and Held   Signed and Held  Phosphorus  Once,   R     Signed and Held          Vitals/Pain Today's Vitals   05/23/18 1430 05/23/18 1530 05/23/18 1600 05/23/18 1630  BP: (!) 118/59 116/84 (!) 118/59 128/69  Pulse: (!) 109 (!) 102 100 100  Resp: (!) 29 (!) 21 (!) 21 19  Temp: 98.4 F (36.9 C)     TempSrc: Oral     SpO2: 99% 98% 97% 98%  Weight:      Height:      PainSc:        Isolation Precautions No active isolations  Medications Medications  insulin regular, human (MYXREDLIN) 100 units/ 100 mL infusion (4.5 Units/hr Intravenous Rate/Dose Change 05/23/18 1624)  potassium chloride 10 mEq in 100 mL IVPB (0 mEq Intravenous Stopped 05/23/18 1446)  dextrose 5 %-0.45 % sodium chloride infusion ( Intravenous New Bag/Given 05/23/18 1456)  0.9 %  sodium chloride infusion ( Intravenous Stopped 05/23/18 1447)  sodium chloride 0.9 % bolus 1,000 mL (0 mLs Intravenous Stopped 05/23/18 1209)  sodium chloride 0.9 % bolus 1,000 mL (0 mLs Intravenous Stopped 05/23/18  1323)  ondansetron (ZOFRAN) injection 4 mg (4 mg Intravenous Given 05/23/18 1135)  morphine 4 MG/ML injection 4 mg (4 mg Intravenous Given 05/23/18 1135)    Mobility walks

## 2018-05-23 NOTE — ED Notes (Signed)
This RN entered pt room to find pt eating home food (peanut butter and crackers).  This RN informed pt that she is not to eat or drink anything until we are able to get her blood sugar under control.  Pt agreeable to this plan.  Will continue to monitor.

## 2018-05-23 NOTE — ED Notes (Signed)
Attempted report x1. 

## 2018-05-23 NOTE — ED Triage Notes (Signed)
Patient states she has not had insulin x 11 days. Patient states she came to the Ed 11 days ago and her meds were left on the ambulance and did not get her insulin back. Patient c/o emesis and abdominal pain since last night.

## 2018-05-23 NOTE — Progress Notes (Signed)
Patient had 10 beat run of vtach. MD paged. Patient not complaining of chest pain and no signs of distress. Will continue to monitor.

## 2018-05-23 NOTE — ED Notes (Signed)
ED Provider at bedside. 

## 2018-05-23 NOTE — ED Notes (Signed)
Offered to place patient on the Purewick, as she is having to use the restroom less than 30 minutes apart. Pt refused to have Purewick placed.

## 2018-05-23 NOTE — H&P (Signed)
History and Physical    Alexandra Elliott YQM:[id number] DOB: [birth year] DOA: 05/23/2018  PCP: Wilburn Mylar, MD   Patient coming from: home  Chief Complaint: emesis and abdominal pain  HPI: Alexandra Elliott is a 48 y.o. female with medical history significant of type 1 diabetes on insulin, hypertension, reflux and recent admission for community-acquired pneumonia who presents with abdominal pain and emesis in the setting of no longer taking her insulin.  Patient was recently discharged on 05/12/2018 with community-acquired pneumonia.  She presented again on 05/17/2018 for intermittent episodes of shortness of breath and cough where she was evaluated and felt to have completed treatment with pneumonia and was discharged home.  During that hospitalization on EMS patient forgot her insulin pens.  She reports that she did not call her provider for more she did not have the means to pay for them.  She reports that she has been trying to manage without the insulin by drinking primarily clear liquids and avoiding lots of sugary stuff.  For the past 2 to 3 days she has had increasing abdominal pain that is diffuse.  She has had episodes of nonbilious nonbloody emesis.  She has had anorexia and decreased p.o. intake.  She reports a chronic cough since her pneumonia but has not had any new fevers or productive cough.  Patient does also report shortness of breath and exertional dyspnea that this appears to be relatively chronic.  Patient denies any chest pain.  Patient has chronic lower extremity edema that is been relatively unchanged.  ED Course: In the ER patient was noted to be tachycardic and hypertensive.  Labs are notable for a blood sugar in the 500s with slight gap and acidosis.  VBG was normal.  CBC was reassuring.  Chest x-ray showed lobar infiltrate similar to prior.  Patient was given 2 IV boluses of fluid and started on insulin drip.  Review of Systems: As per HPI otherwise 10 point review of systems  negative.    Past Medical History:  Diagnosis Date  . Diabetes mellitus without complication (HCC)   . GERD (gastroesophageal reflux disease)   . High cholesterol   . Hypertension     Past Surgical History:  Procedure Laterality Date  . DILATION AND CURETTAGE OF UTERUS    . HERNIA REPAIR       reports that she has quit smoking. She has never used smokeless tobacco. She reports that she does not drink alcohol or use drugs.  Allergies  Allergen Reactions  . Levofloxacin Other (See Comments)    Loss of hearing  . Lisinopril Swelling    Had lip swelling but determined not to be related to medication    Family History  Problem Relation Age of Onset  . Diabetes Mellitus II Mother   . Diabetes Mellitus II Father   . Diabetes Mellitus I Brother   . Kidney disease Brother      Prior to Admission medications   Medication Sig Start Date End Date Taking? Authorizing Provider  albuterol (PROVENTIL HFA;VENTOLIN HFA) 108 (90 Base) MCG/ACT inhaler Inhale 2 puffs into the lungs every 4 (four) hours as needed for wheezing or shortness of breath. 05/12/18  Yes Smith, Rondell A, MD  amLODipine (NORVASC) 10 MG tablet Take 10 mg by mouth daily.   Yes [provider]  aspirin EC 81 MG EC tablet Take 1 tablet (81 mg total) by mouth daily. 05/13/18  Yes Madelyn Flavors A, MD  fenofibrate 160 MG tablet Take 160 mg  by mouth daily.   Yes [provider]  ferrous sulfate 325 (65 FE) MG EC tablet Take 325 mg by mouth daily.   Yes [provider]  furosemide (LASIX) 20 MG tablet Take 1 tablet (20 mg total) by mouth daily. 05/12/18 05/12/19 Yes Clydie Braun, MD  hydrOXYzine (ATARAX/VISTARIL) 25 MG tablet Take 25 mg by mouth 3 (three) times daily as needed for anxiety.    Yes [provider]  Insulin Glargine, 2 Unit Dial, (TOUJEO MAX SOLOSTAR) 300 UNIT/ML SOPN Inject 40 Units into the skin daily. Patient taking differently: Inject 40 Units into the skin 2 (two) times  daily.  05/12/18  Yes Smith, Arn Medal A, MD  insulin lispro (HUMALOG KWIKPEN) 100 UNIT/ML KwikPen Inject 0.25 mLs (25 Units total) into the skin See admin instructions. Inject 25 units with each meal, and 10 units with a snack at night. Patient taking differently: Inject 40 Units into the skin See admin instructions. Inject 40 units with each meal, and 10 units with a snack at night. 05/12/18  Yes Madelyn Flavors A, MD  losartan (COZAAR) 25 MG tablet Take 1 tablet (25 mg total) by mouth daily. 05/12/18  Yes Smith, Arn Medal A, MD  lovastatin (MEVACOR) 40 MG tablet Take 40 mg by mouth at bedtime.   Yes [provider]  pantoprazole (PROTONIX) 40 MG tablet Take 40 mg by mouth at bedtime.    Yes [provider]  doxycycline (VIBRA-TABS) 100 MG tablet Take 100 mg by mouth 2 (two) times daily. Take 100mg  by mouth twice daily for three days. Course of therapy began 05-12-18 to 05-15-18. 05/12/18   [provider]  guaiFENesin (MUCINEX) 600 MG 12 hr tablet Take 1 tablet (600 mg total) by mouth 2 (two) times daily. Patient not taking: Reported on 05/23/2018 05/17/18 05/17/19  Elgergawy, Leana Roe, MD  guaiFENesin (MUCINEX) 600 MG 12 hr tablet Take 2 tablets (1,200 mg total) by mouth 2 (two) times daily. Patient not taking: Reported on 05/23/2018 05/17/18   Elgergawy, Leana Roe, MD    Physical Exam: Vitals:   05/23/18 1030 05/23/18 1100 05/23/18 1134 05/23/18 1243  BP: (!) 172/104 (!) 145/87 (!) 146/117 125/67  Pulse: (!) 115 (!) 116 (!) 118 (!) 109  Resp:  (!) 29 (!) 25 (!) 22  Temp:   98.1 F (36.7 C)   TempSrc:   Oral   SpO2: 100% 99% 100% 100%  Weight:      Height:        Constitutional: NAD, calm, comfortable Vitals:   05/23/18 1030 05/23/18 1100 05/23/18 1134 05/23/18 1243  BP: (!) 172/104 (!) 145/87 (!) 146/117 125/67  Pulse: (!) 115 (!) 116 (!) 118 (!) 109  Resp:  (!) 29 (!) 25 (!) 22  Temp:   98.1 F (36.7 C)   TempSrc:   Oral   SpO2: 100% 99% 100% 100%  Weight:       Height:       Eyes: Anicteric sclera ENMT: Dry mucous membranes.  Neck:  pickwickian Respiratory: No increased work of breathing, diminished lung sounds at bases, no crackles or rhonchi Cardiovascular: Tachycardic, regular rhythm, no murmurs.  Abdomen: Soft, nondistended, no rebound or guarding, plus bowel sounds Musculoskeletal: Trace lower extremity edema Skin: no rashes over visible skin Neurologic: Grossly intact, moving all extremities Psychiatric: Normal judgment and insight. Alert and oriented x 3. Normal mood.    Labs on Admission: I have personally reviewed following labs and imaging studies  CBC: Recent Labs  Lab 05/17/18 1237 05/23/18 1031  WBC 7.8 7.7  NEUTROABS  --  4.2  HGB 13.5 15.1*  HCT 41.6 46.0  MCV 93.7 92.6  PLT 354 299   Basic Metabolic Panel: Recent Labs  Lab 05/17/18 1237 05/23/18 1031  NA 137 133*  K 3.8 4.4  CL 101 100  CO2 19* 15*  GLUCOSE 293* 556*  BUN 8 13  CREATININE 0.94 0.98  CALCIUM 9.1 10.1   GFR: Estimated Creatinine Clearance: 102.1 mL/min (by C-G formula based on SCr of 0.98 mg/dL). Liver Function Tests: Recent Labs  Lab 05/23/18 1031  AST 15  ALT 24  ALKPHOS 83  BILITOT 1.7*  PROT 8.5*  ALBUMIN 4.2   Recent Labs  Lab 05/23/18 1031  LIPASE 31   No results for input(s): AMMONIA in the last 168 hours. Coagulation Profile: No results for input(s): INR, PROTIME in the last 168 hours. Cardiac Enzymes: No results for input(s): CKTOTAL, CKMB, CKMBINDEX, TROPONINI in the last 168 hours. BNP (last 3 results) No results for input(s): PROBNP in the last 8760 hours. HbA1C: No results for input(s): HGBA1C in the last 72 hours. CBG: Recent Labs  Lab 05/23/18 0943 05/23/18 1238  GLUCAP 493* 415*   Lipid Profile: No results for input(s): CHOL, HDL, LDLCALC, TRIG, CHOLHDL, LDLDIRECT in the last 72 hours. Thyroid Function Tests: No results for input(s): TSH, T4TOTAL, FREET4, T3FREE, THYROIDAB in the last 72  hours. Anemia Panel: No results for input(s): VITAMINB12, FOLATE, FERRITIN, TIBC, IRON, RETICCTPCT in the last 72 hours. Urine analysis:    Component Value Date/Time   COLORURINE STRAW (A) 05/23/2018 1031   APPEARANCEUR CLEAR 05/23/2018 1031   LABSPEC 1.032 (H) 05/23/2018 1031   PHURINE 5.0 05/23/2018 1031   GLUCOSEU >=500 (A) 05/23/2018 1031   HGBUR NEGATIVE 05/23/2018 1031   BILIRUBINUR NEGATIVE 05/23/2018 1031   KETONESUR 80 (A) 05/23/2018 1031   PROTEINUR NEGATIVE 05/23/2018 1031   NITRITE NEGATIVE 05/23/2018 1031   LEUKOCYTESUR NEGATIVE 05/23/2018 1031    Radiological Exams on Admission: Dg Chest 2 View  Result Date: 05/23/2018 CLINICAL DATA:  Shortness of breath EXAM: CHEST - 2 VIEW COMPARISON:  05/17/2018 FINDINGS: Cardiac shadows within normal limits. The lungs are well aerated bilaterally. Persistent left basilar infiltrate is noted. No new focal infiltrate or effusion is seen. Degenerative changes of the thoracic spine are noted. IMPRESSION: Stable left basilar infiltrate. Electronically Signed   By: Alcide CleverMark  Lukens M.D.   On: 05/23/2018 12:37    EKG: Independently reviewed.  Sinus tachycardia, no acute ST segment changes, poor quality EKG, unchanged from prior  Assessment/Plan Active Problems:   Diabetes mellitus without complication (HCC)   Hypertension   Hyperglycemia   #) Nausea/vomiting/abdominal pain/type 2 diabetes/DKA: This is in the setting of noncompliance with her insulin in the setting of forgetting it.  She reports that her card to pay for the new insulin should be in the mail.  Her last A1c approximately 2 weeks ago was 8.9 which is actually not tremendously bad. -Continue IV insulin, every hour blood sugar checks - We will repeat BMP today and tomorrow but suspect patient will rapidly close gap as this is very mild DKA -Clear liquid diet -Aggressive IV hydration -Start home glargine 40 units twice daily -Hold lispro 40 units with meals and 10 units with  snack at night  #) Hypertension/hyperlipidemia: -Continue fenofibrate 160 mg daily -Continue aspirin 81 mg daily -Continue amlodipine 10 mg daily - Continue losartan 25 mg daily -Continue lovastatin 40 mg  nightly  #) Shortness of breath recently treated pneumonia: Patient reports chronic shortness of breath.  She will likely need evaluation for possible COPD or asthma.  Not tremendously surprising the patient continues to have infiltrate on x-ray -Continue PRN bronchodilators -Continue antitussives  #) GERD: -Continue PPI  #) Lower extremity edema: -Hold furosemide 20 mg daily  #) Iron deficiency anemia: -Continue iron supplementation  #) Pain/psych: -Continue hydroxyzine 25 mg 3 times daily as needed for anxiety  Fluids: IV fluids per above Electrolytes: Monitor and supplement Nutrition: Clear liquid diet  Prophylaxis: Enoxaparin  Disposition: Pending resolution of hyperglycemia and gap  Full code   Delaine Lame MD [facility]  If 7PM-7AM, please contact night-coverage [email or web address] Password [email or web address]  05/23/2018, 1:06 PM

## 2018-05-23 NOTE — Progress Notes (Signed)
CSW acknowledges consult for medication assistance. For further assistance with this matte rplease consult RNCM. At this time CSW will sign off as there are no further CSW needs warranted.    Please reconsult if need arises.     Claude Manges Jamyra Zweig, MSW, LCSW-A Emergency Department Clinical Social Worker [phone]

## 2018-05-23 NOTE — ED Provider Notes (Signed)
[facility]-EMERGENCY DEPT Provider Note   CSN: [id number] Arrival date & time: 05/23/18  2440     History   Chief Complaint Chief Complaint  Patient presents with  . Hyperglycemia  . Emesis  . Abdominal Pain    HPI Alexandra Elliott is a 48 y.o. female.  HPI   48 yo F with PMHx T1DM here with SOB, nausea, fatigue. Pt states that since her ED visit 11 days ago, she has been out of her insulin. She has subsequently developed worsening polyuria, polydipsia, and fatigue. She was seen here on 1/17 for SOB and diagnosed with PNA, sent home. She has been unable to get her insulin since then. She reports that she has now developed nausea, vomiting, and worsening dyspnea, with extreme fatigue. She has been urinating constantly. Denies any fever or sputum production. She states she normally is fairly controlled but her sugars have now been >300 to 400 consistently. No HA or confusion. No other complaints.  Past Medical History:  Diagnosis Date  . Diabetes mellitus without complication (HCC)   . GERD (gastroesophageal reflux disease)   . High cholesterol   . HLD (hyperlipidemia) 05/23/2018  . Hypertension     Patient Active Problem List   Diagnosis Date Noted  . Hyperglycemia 05/23/2018  . HLD (hyperlipidemia) 05/23/2018  . Nausea and vomiting 05/23/2018  . DKA (diabetic ketoacidoses) (HCC) 05/23/2018  . Diabetes mellitus without complication (HCC)   . GERD (gastroesophageal reflux disease)   . Hypertension     Past Surgical History:  Procedure Laterality Date  . DILATION AND CURETTAGE OF UTERUS    . HERNIA REPAIR       OB History   No obstetric history on file.      Home Medications    Prior to Admission medications   Medication Sig Start Date End Date Taking? Authorizing Provider  albuterol (PROVENTIL HFA;VENTOLIN HFA) 108 (90 Base) MCG/ACT inhaler Inhale 2 puffs into the lungs every 4 (four) hours as needed for wheezing or shortness of breath.  05/12/18  Yes Smith, Rondell A, MD  amLODipine (NORVASC) 10 MG tablet Take 10 mg by mouth daily.   Yes [provider]  aspirin EC 81 MG EC tablet Take 1 tablet (81 mg total) by mouth daily. 05/13/18  Yes Madelyn Flavors A, MD  fenofibrate 160 MG tablet Take 160 mg by mouth daily.   Yes [provider]  ferrous sulfate 325 (65 FE) MG EC tablet Take 325 mg by mouth daily.   Yes [provider]  furosemide (LASIX) 20 MG tablet Take 1 tablet (20 mg total) by mouth daily. 05/12/18 05/12/19 Yes Clydie Braun, MD  hydrOXYzine (ATARAX/VISTARIL) 25 MG tablet Take 25 mg by mouth 3 (three) times daily as needed for anxiety.    Yes [provider]  Insulin Glargine, 2 Unit Dial, (TOUJEO MAX SOLOSTAR) 300 UNIT/ML SOPN Inject 40 Units into the skin daily. Patient taking differently: Inject 40 Units into the skin 2 (two) times daily.  05/12/18  Yes Smith, Arn Medal A, MD  insulin lispro (HUMALOG KWIKPEN) 100 UNIT/ML KwikPen Inject 0.25 mLs (25 Units total) into the skin See admin instructions. Inject 25 units with each meal, and 10 units with a snack at night. Patient taking differently: Inject 40 Units into the skin See admin instructions. Inject 40 units with each meal, and 10 units with a snack at night. 05/12/18  Yes Madelyn Flavors A, MD  losartan (COZAAR) 25 MG tablet Take 1 tablet (  25 mg total) by mouth daily. 05/12/18  Yes Smith, Arn Medal A, MD  lovastatin (MEVACOR) 40 MG tablet Take 40 mg by mouth at bedtime.   Yes [provider]  pantoprazole (PROTONIX) 40 MG tablet Take 40 mg by mouth at bedtime.    Yes [provider]  doxycycline (VIBRA-TABS) 100 MG tablet Take 100 mg by mouth 2 (two) times daily. Take 100mg  by mouth twice daily for three days. Course of therapy began 05-12-18 to 05-15-18. 05/12/18   [provider]  guaiFENesin (MUCINEX) 600 MG 12 hr tablet Take 1 tablet (600 mg total) by mouth 2 (two) times daily. Patient not taking: Reported on  05/23/2018 05/17/18 05/17/19  Elgergawy, Leana Roe, MD  guaiFENesin (MUCINEX) 600 MG 12 hr tablet Take 2 tablets (1,200 mg total) by mouth 2 (two) times daily. Patient not taking: Reported on 05/23/2018 05/17/18   Elgergawy, Leana Roe, MD    Family History Family History  Problem Relation Age of Onset  . Diabetes Mellitus II Mother   . Diabetes Mellitus II Father   . Diabetes Mellitus I Brother   . Kidney disease Brother     Social History Social History   Tobacco Use  . Smoking status: Former Games developer  . Smokeless tobacco: Never Used  Substance Use Topics  . Alcohol use: Never    Frequency: Never  . Drug use: Never     Allergies   Levofloxacin and Lisinopril   Review of Systems Review of Systems  Constitutional: Positive for fatigue. Negative for chills and fever.  HENT: Negative for congestion and rhinorrhea.   Eyes: Negative for visual disturbance.  Respiratory: Positive for cough and shortness of breath. Negative for wheezing.   Cardiovascular: Negative for chest pain and leg swelling.  Gastrointestinal: Positive for nausea and vomiting. Negative for abdominal pain and diarrhea.  Endocrine: Positive for polydipsia, polyphagia and polyuria.  Genitourinary: Negative for dysuria and flank pain.  Musculoskeletal: Negative for neck pain and neck stiffness.  Skin: Negative for rash and wound.  Allergic/Immunologic: Negative for immunocompromised state.  Neurological: Positive for weakness. Negative for syncope and headaches.  All other systems reviewed and are negative.    Physical Exam Updated Vital Signs BP (!) 111/93   Pulse 100   Temp 98.4 F (36.9 C) (Oral)   Resp 20   Ht 5\' 3"  (1.6 m)   Wt (!) 154.2 kg   LMP 05/03/2018 (Approximate)   SpO2 99%   BMI 60.23 kg/m   Physical Exam Vitals signs and nursing note reviewed.  Constitutional:      General: She is not in acute distress.    Appearance: She is well-developed.  HENT:     Head: Normocephalic and  atraumatic.     Mouth/Throat:     Mouth: Mucous membranes are dry.  Eyes:     Conjunctiva/sclera: Conjunctivae normal.  Neck:     Musculoskeletal: Neck supple.  Cardiovascular:     Rate and Rhythm: Normal rate and regular rhythm.     Heart sounds: Normal heart sounds. No murmur. No friction rub.  Pulmonary:     Effort: Pulmonary effort is normal. No respiratory distress.     Breath sounds: Normal breath sounds. No wheezing or rales.  Abdominal:     General: Bowel sounds are normal. There is no distension.     Palpations: Abdomen is soft.     Tenderness: There is no abdominal tenderness.  Skin:    General: Skin is warm.  Capillary Refill: Capillary refill takes less than 2 seconds.  Neurological:     Mental Status: She is alert and oriented to person, place, and time.     Motor: No abnormal muscle tone.      ED Treatments / Results  Labs (all labs ordered are listed, but only abnormal results are displayed) Labs Reviewed  CBC WITH DIFFERENTIAL/PLATELET - Abnormal; Notable for the following components:      Result Value   Hemoglobin 15.1 (*)    All other components within normal limits  COMPREHENSIVE METABOLIC PANEL - Abnormal; Notable for the following components:   Sodium 133 (*)    CO2 15 (*)    Glucose, Bld 556 (*)    Total Protein 8.5 (*)    Total Bilirubin 1.7 (*)    Anion gap 18 (*)    All other components within normal limits  BLOOD GAS, VENOUS - Abnormal; Notable for the following components:   pCO2, Ven 35.6 (*)    Bicarbonate 15.8 (*)    Acid-base deficit 9.9 (*)    All other components within normal limits  URINALYSIS, ROUTINE W REFLEX MICROSCOPIC - Abnormal; Notable for the following components:   Color, Urine STRAW (*)    Specific Gravity, Urine 1.032 (*)    Glucose, UA >=500 (*)    Ketones, ur 80 (*)    Bacteria, UA RARE (*)    All other components within normal limits  BASIC METABOLIC PANEL - Abnormal; Notable for the following components:    CO2 19 (*)    Glucose, Bld 155 (*)    All other components within normal limits  PHOSPHORUS - Abnormal; Notable for the following components:   Phosphorus 1.8 (*)    All other components within normal limits  GLUCOSE, CAPILLARY - Abnormal; Notable for the following components:   Glucose-Capillary 173 (*)    All other components within normal limits  GLUCOSE, CAPILLARY - Abnormal; Notable for the following components:   Glucose-Capillary 150 (*)    All other components within normal limits  GLUCOSE, CAPILLARY - Abnormal; Notable for the following components:   Glucose-Capillary 188 (*)    All other components within normal limits  CBG MONITORING, ED - Abnormal; Notable for the following components:   Glucose-Capillary 493 (*)    All other components within normal limits  CBG MONITORING, ED - Abnormal; Notable for the following components:   Glucose-Capillary 415 (*)    All other components within normal limits  CBG MONITORING, ED - Abnormal; Notable for the following components:   Glucose-Capillary 430 (*)    All other components within normal limits  CBG MONITORING, ED - Abnormal; Notable for the following components:   Glucose-Capillary 232 (*)    All other components within normal limits  CBG MONITORING, ED - Abnormal; Notable for the following components:   Glucose-Capillary 209 (*)    All other components within normal limits  MRSA PCR SCREENING  LIPASE, BLOOD  MAGNESIUM  BASIC METABOLIC PANEL  BASIC METABOLIC PANEL    EKG None  Radiology Dg Chest 2 View  Result Date: 05/23/2018 CLINICAL DATA:  Shortness of breath EXAM: CHEST - 2 VIEW COMPARISON:  05/17/2018 FINDINGS: Cardiac shadows within normal limits. The lungs are well aerated bilaterally. Persistent left basilar infiltrate is noted. No new focal infiltrate or effusion is seen. Degenerative changes of the thoracic spine are noted. IMPRESSION: Stable left basilar infiltrate. Electronically Signed   By: Alcide CleverMark  Lukens  M.D.   On: 05/23/2018 12:37  Procedures Ultrasound ED Peripheral IV (Provider) Date/Time: 05/23/2018 7:56 PM Performed by: Shaune PollackIsaacs, Yashas Camilli, MD Authorized by: Shaune PollackIsaacs, Raef Sprigg, MD   Procedure details:    Indications: hydration, multiple failed IV attempts and poor IV access     Skin Prep: chlorhexidine gluconate     Location:  Right forearm   Angiocath:  20 G   Bedside Ultrasound Guided: Yes     Images: archived     Patient tolerated procedure without complications: Yes     Dressing applied: Yes   .Critical Care Performed by: Shaune PollackIsaacs, Vasilia Dise, MD Authorized by: Shaune PollackIsaacs, Emmalynn Pinkham, MD   Critical care provider statement:    Critical care time (minutes):  35   Critical care time was exclusive of:  Separately billable procedures and treating other patients and teaching time   Critical care was necessary to treat or prevent imminent or life-threatening deterioration of the following conditions:  Cardiac failure, circulatory failure and metabolic crisis   Critical care was time spent personally by me on the following activities:  Development of treatment plan with patient or surrogate, discussions with consultants, evaluation of patient's response to treatment, examination of patient, obtaining history from patient or surrogate, ordering and performing treatments and interventions, ordering and review of laboratory studies, ordering and review of radiographic studies, pulse oximetry, re-evaluation of patient's condition and review of old charts   I assumed direction of critical care for this patient from another provider in my specialty: no     (including critical care time)  Medications Ordered in ED Medications  insulin regular, human (MYXREDLIN) 100 units/ 100 mL infusion ( Intravenous Rate/Dose Verify 05/23/18 1900)  potassium chloride 10 mEq in 100 mL IVPB (0 mEq Intravenous Stopped 05/23/18 1446)  0.9 %  sodium chloride infusion (has no administration in time range)  0.9 %  sodium  chloride infusion (has no administration in time range)  dextrose 5 %-0.45 % sodium chloride infusion ( Intravenous Rate/Dose Verify 05/23/18 1900)  enoxaparin (LOVENOX) injection 40 mg (has no administration in time range)  acetaminophen (TYLENOL) tablet 650 mg (has no administration in time range)    Or  acetaminophen (TYLENOL) suppository 650 mg (has no administration in time range)  traMADol (ULTRAM) tablet 50 mg (has no administration in time range)  ondansetron (ZOFRAN) tablet 4 mg (has no administration in time range)    Or  ondansetron (ZOFRAN) injection 4 mg (has no administration in time range)  polyethylene glycol (MIRALAX / GLYCOLAX) packet 17 g (has no administration in time range)  0.9 %  sodium chloride infusion ( Intravenous Stopped 05/23/18 1447)  sodium chloride 0.9 % bolus 1,000 mL (0 mLs Intravenous Stopped 05/23/18 1209)  sodium chloride 0.9 % bolus 1,000 mL (0 mLs Intravenous Stopped 05/23/18 1323)  ondansetron (ZOFRAN) injection 4 mg (4 mg Intravenous Given 05/23/18 1135)  morphine 4 MG/ML injection 4 mg (4 mg Intravenous Given 05/23/18 1135)     Initial Impression / Assessment and Plan / ED Course  I have reviewed the triage vital signs and the nursing notes.  Pertinent labs & imaging results that were available during my care of the patient were reviewed by me and considered in my medical decision making (see chart for details).     48 yo F here with nausea, SOB, abd pain in setting of being out of her insulin x 11 days. Labs, imaging is consistent with moderate DKA, with metabolic acidosis, elevated AG, and significant ketonuria. No signs of infectious process. VSS. IVF, IV insulin started  and will admit to step down. Mentating well, no neuro deficits. No signs of encephalopathy.  Final Clinical Impressions(s) / ED Diagnoses   Final diagnoses:  Diabetic ketoacidosis without coma associated with type 1 diabetes mellitus [facility])    ED Discharge Orders    None        Shaune Pollack, MD 05/23/18 1958

## 2018-05-24 ENCOUNTER — Encounter (HOSPITAL_COMMUNITY): Payer: Self-pay | Admitting: Radiology

## 2018-05-24 ENCOUNTER — Observation Stay (HOSPITAL_COMMUNITY): Payer: Medicare Other

## 2018-05-24 DIAGNOSIS — E1010 Type 1 diabetes mellitus with ketoacidosis without coma: Secondary | ICD-10-CM | POA: Diagnosis not present

## 2018-05-24 DIAGNOSIS — I472 Ventricular tachycardia: Secondary | ICD-10-CM | POA: Diagnosis not present

## 2018-05-24 DIAGNOSIS — R112 Nausea with vomiting, unspecified: Secondary | ICD-10-CM | POA: Diagnosis not present

## 2018-05-24 LAB — BASIC METABOLIC PANEL
Anion gap: 7 (ref 5–15)
Anion gap: 8 (ref 5–15)
BUN: 8 mg/dL (ref 6–20)
BUN: 7 mg/dL (ref 6–20)
CO2: 19 mmol/L — ABNORMAL LOW (ref 22–32)
Calcium: 8.8 mg/dL — ABNORMAL LOW (ref 8.9–10.3)
Chloride: 109 mmol/L (ref 98–111)
Chloride: 105 mmol/L (ref 98–111)
Creatinine, Ser: 0.59 mg/dL (ref 0.44–1.00)
Creatinine, Ser: 0.67 mg/dL (ref 0.44–1.00)
GFR calc Af Amer: >60 mL/min (ref >60)
GFR calc non Af Amer: >60 mL/min (ref >60)
GFR calc non Af Amer: >60 mL/min (ref >60)
Glucose, Bld: 195 mg/dL — ABNORMAL HIGH (ref 70–99)
Glucose, Bld: 152 mg/dL — ABNORMAL HIGH (ref 70–99)
Potassium: 3.1 mmol/L — ABNORMAL LOW (ref 3.5–5.1)
Potassium: 3.9 mmol/L (ref 3.5–5.1)
Sodium: 135 mmol/L (ref 135–145)
Sodium: 134 mmol/L — ABNORMAL LOW (ref 135–145)

## 2018-05-24 LAB — ECHOCARDIOGRAM COMPLETE
Height: 63 in
Weight: 5160.53 oz

## 2018-05-24 LAB — GLUCOSE, CAPILLARY
Glucose-Capillary: 112 mg/dL — ABNORMAL HIGH (ref 70–99)
Glucose-Capillary: 127 mg/dL — ABNORMAL HIGH (ref 70–99)
Glucose-Capillary: 129 mg/dL — ABNORMAL HIGH (ref 70–99)
Glucose-Capillary: 130 mg/dL — ABNORMAL HIGH (ref 70–99)
Glucose-Capillary: 155 mg/dL — ABNORMAL HIGH (ref 70–99)
Glucose-Capillary: 166 mg/dL — ABNORMAL HIGH (ref 70–99)
Glucose-Capillary: 186 mg/dL — ABNORMAL HIGH (ref 70–99)
Glucose-Capillary: 192 mg/dL — ABNORMAL HIGH (ref 70–99)
Glucose-Capillary: 202 mg/dL — ABNORMAL HIGH (ref 70–99)
Glucose-Capillary: 238 mg/dL — ABNORMAL HIGH (ref 70–99)
Glucose-Capillary: 314 mg/dL — ABNORMAL HIGH (ref 70–99)
Glucose-Capillary: 70 mg/dL (ref 70–99)

## 2018-05-24 LAB — TROPONIN I: Troponin I: <0.03 ng/mL (ref <0.03)

## 2018-05-24 LAB — BASIC METABOLIC PANEL WITH GFR
CO2: 21 mmol/L — ABNORMAL LOW (ref 22–32)
Calcium: 8.6 mg/dL — ABNORMAL LOW (ref 8.9–10.3)
GFR calc Af Amer: >60 mL/min (ref >60)

## 2018-05-24 MED ORDER — FENOFIBRATE 160 MG PO TABS
160.0000 mg | ORAL_TABLET | Freq: Every day | ORAL | Status: DC
  Administered 2018-05-24 – 2018-05-25 (×2): 160 mg via ORAL
  Filled 2018-05-24 (×2): qty 1

## 2018-05-24 MED ORDER — ENOXAPARIN SODIUM 80 MG/0.8ML ~~LOC~~ SOLN
70.0000 mg | SUBCUTANEOUS | Status: DC
  Administered 2018-05-24: 70 mg via SUBCUTANEOUS
  Filled 2018-05-24: qty 0.7
  Filled 2018-05-24: qty 0.8

## 2018-05-24 MED ORDER — POTASSIUM PHOSPHATES 15 MMOLE/5ML IV SOLN
30.0000 mmol | Freq: Once | INTRAVENOUS | Status: AC
  Administered 2018-05-24: 30 mmol via INTRAVENOUS
  Filled 2018-05-24: qty 10

## 2018-05-24 MED ORDER — INSULIN ASPART 100 UNIT/ML ~~LOC~~ SOLN
40.0000 [IU] | Freq: Three times a day (TID) | SUBCUTANEOUS | Status: DC
  Administered 2018-05-24: 40 [IU] via SUBCUTANEOUS
  Administered 2018-05-24: 25 [IU] via SUBCUTANEOUS

## 2018-05-24 MED ORDER — FERROUS SULFATE 325 (65 FE) MG PO TABS
325.0000 mg | ORAL_TABLET | Freq: Every day | ORAL | Status: DC
  Administered 2018-05-24 – 2018-05-25 (×2): 325 mg via ORAL
  Filled 2018-05-24 (×2): qty 1

## 2018-05-24 MED ORDER — IOHEXOL 300 MG/ML  SOLN
15.0000 mL | Freq: Once | INTRAMUSCULAR | Status: DC | PRN
  Filled 2018-05-24: qty 20

## 2018-05-24 MED ORDER — INSULIN ASPART 100 UNIT/ML ~~LOC~~ SOLN
5.0000 [IU] | Freq: Once | SUBCUTANEOUS | Status: AC
  Administered 2018-05-25: 5 [IU] via SUBCUTANEOUS

## 2018-05-24 MED ORDER — IOHEXOL 300 MG/ML  SOLN
100.0000 mL | Freq: Once | INTRAMUSCULAR | Status: AC | PRN
  Administered 2018-05-24: 100 mL via INTRAVENOUS

## 2018-05-24 MED ORDER — INSULIN ASPART 100 UNIT/ML ~~LOC~~ SOLN
25.0000 [IU] | Freq: Three times a day (TID) | SUBCUTANEOUS | Status: DC
  Administered 2018-05-25 (×2): 25 [IU] via SUBCUTANEOUS

## 2018-05-24 MED ORDER — PRAVASTATIN SODIUM 40 MG PO TABS
40.0000 mg | ORAL_TABLET | Freq: Every day | ORAL | Status: DC
  Administered 2018-05-24: 40 mg via ORAL
  Filled 2018-05-24: qty 2

## 2018-05-24 MED ORDER — INSULIN GLARGINE 100 UNIT/ML ~~LOC~~ SOLN
40.0000 [IU] | Freq: Two times a day (BID) | SUBCUTANEOUS | Status: DC
  Administered 2018-05-24 – 2018-05-25 (×2): 40 [IU] via SUBCUTANEOUS
  Filled 2018-05-24 (×4): qty 0.4

## 2018-05-24 MED ORDER — AMLODIPINE BESYLATE 10 MG PO TABS
10.0000 mg | ORAL_TABLET | Freq: Every day | ORAL | Status: DC
  Administered 2018-05-24 – 2018-05-25 (×2): 10 mg via ORAL
  Filled 2018-05-24 (×2): qty 1

## 2018-05-24 MED ORDER — IOHEXOL 300 MG/ML  SOLN: 15.0000 mL | Freq: Once | INTRAMUSCULAR | Status: DC | PRN

## 2018-05-24 MED ORDER — ASPIRIN EC 81 MG PO TBEC
81.0000 mg | DELAYED_RELEASE_TABLET | Freq: Every day | ORAL | Status: DC
  Administered 2018-05-24 – 2018-05-25 (×2): 81 mg via ORAL
  Filled 2018-05-24 (×2): qty 1

## 2018-05-24 MED ORDER — INSULIN LISPRO (1 UNIT DIAL) 100 UNIT/ML (KWIKPEN): 40.0000 [IU] | PEN_INJECTOR | SUBCUTANEOUS | Status: DC

## 2018-05-24 MED ORDER — LOSARTAN POTASSIUM 25 MG PO TABS
25.0000 mg | ORAL_TABLET | Freq: Every day | ORAL | Status: DC
  Administered 2018-05-24 – 2018-05-25 (×2): 25 mg via ORAL
  Filled 2018-05-24 (×2): qty 1

## 2018-05-24 MED ORDER — INSULIN ASPART 100 UNIT/ML ~~LOC~~ SOLN: 10.0000 [IU] | Freq: Every day | SUBCUTANEOUS | Status: DC

## 2018-05-24 MED ORDER — PERFLUTREN LIPID MICROSPHERE
1.0000 mL | INTRAVENOUS | Status: AC | PRN
  Administered 2018-05-24: 3 mL via INTRAVENOUS
  Filled 2018-05-24: qty 10

## 2018-05-24 MED ORDER — PANTOPRAZOLE SODIUM 40 MG PO TBEC
40.0000 mg | DELAYED_RELEASE_TABLET | Freq: Every day | ORAL | Status: DC
  Administered 2018-05-24: 40 mg via ORAL
  Filled 2018-05-24: qty 1

## 2018-05-24 MED ORDER — SODIUM CHLORIDE (PF) 0.9 % IJ SOLN
INTRAMUSCULAR | Status: AC
  Filled 2018-05-24: qty 50

## 2018-05-24 MED ORDER — HYDROXYZINE HCL 25 MG PO TABS: 25.0000 mg | ORAL_TABLET | Freq: Three times a day (TID) | ORAL | Status: DC | PRN

## 2018-05-24 MED ORDER — POTASSIUM CHLORIDE CRYS ER 20 MEQ PO TBCR
40.0000 meq | EXTENDED_RELEASE_TABLET | Freq: Once | ORAL | Status: AC
  Administered 2018-05-24: 40 meq via ORAL
  Filled 2018-05-24: qty 2

## 2018-05-24 NOTE — Progress Notes (Signed)
PROGRESS NOTE    Alexandra Elliott  RCV:[id number] DOB: 1970/07/21 DOA: 05/23/2018 PCP: Wilburn Mylar, MD   Brief Narrative:   48 y.o. female with medical history significant of type 1 diabetes on insulin, hypertension, reflux and recent admission for community-acquired pneumonia who presents with abdominal pain and emesis in the setting of no longer taking her insulin and found to be in mild DKA.  Her hospital course has been complicated by a brief run of asymptomatic VT and findings of what appear to be migrated hernia mesh repair.   Assessment & Plan:   Principal Problem:   Nausea and vomiting Active Problems:   Diabetes mellitus without complication (HCC)   Hypertension   Hyperglycemia   HLD (hyperlipidemia)   DKA (diabetic ketoacidoses) (HCC)  #) Shortness of breath/VT: Patient had a brief run of nonsustained V. tach last night that was asymptomatic.  Patient does describe exertional shortness of breath that was initially attributed to recently treated pneumonia. -We will check troponin -We will check echo -We will discuss with cardiology -Telemetry  #) type 2 diabetes/DKA: T abdominal pain and nausea and vomiting have resolved as of her severe DKA.  Her gap and hyperglycemia resolved -Discontinue IV insulin -Advance to diabetic diet -Discontinue IV hydration -Continue home glargine 40 units twice daily -Resume lispro 40 units with meals and 10 units with snack at night  #) Migrated abdominal mesh: Patient reports she had surgery for an incarcerated hernia with small bowel infarction 2001 in [place] with a complicated postoperative wound care care course.  She is noted an area that was sore and open that she has been trying to keep clean.  She has had pain in that side for many years.  It does appear on exam that she has mesh that extruded out. -General surgery consult, will plan to see as an outpatient as this is not an active issue - No antibiotics at this time -CT  scan of abdomen pelvis pending per general surgery  #) Hypertension/hyperlipidemia: -Continue fenofibrate 160 mg daily -Continue aspirin 81 mg daily -Continue amlodipine 10 mg daily - Continue losartan 25 mg daily -Continue lovastatin 40 mg nightly  #) GERD: -Continue PPI  #) Lower extremity edema: -Hold furosemide 20 mg daily  #) Iron deficiency anemia: -Continue iron supplementation  #) Pain/psych: -Continue hydroxyzine 25 mg 3 times daily as needed for anxiety  Fluids: Tolerating p.o. Electrolytes: Monitor and supplement Nutrition: Unrestricted diet  Prophylaxis: Enoxaparin  Disposition: Pending resolution evaluation of VT  Full code    Consultants:   General surgery  Cardiology   Procedures:   Echo 05/24/2018: Pending  Antimicrobials:   None   Subjective: This morning the patient reports she is feeling better.  Overnight she had a run of VT that was asymptomatic.  Today nursing staff and the patient noted a small area of pain underneath her pannus in the midline that appeared to be an abdominal mesh that she had repaired in 2011.  She reports that the area is somewhat tender and that she keeps it clean but denies any fevers, redness, nausea, vomiting, diarrhea.  Is been evaluated by general surgery.  Objective: Vitals:   05/24/18 0600 05/24/18 0700 05/24/18 0800 05/24/18 0824  BP: 110/63   (!) 126/55  Pulse: 95   (!) 101  Resp: 17 (!) 22  18  Temp: 98.2 F (36.8 C)  98.1 F (36.7 C)   TempSrc: Oral  Oral   SpO2: 95%   100%  Weight: (!) 146.3 kg     Height:        Intake/Output Summary (Last 24 hours) at 05/24/2018 0932 Last data filed at 05/24/2018 0700 Gross per 24 hour  Intake 4488.51 ml  Output 400 ml  Net 4088.51 ml   Filed Weights   05/23/18 0940 05/24/18 0600  Weight: (!) 154.2 kg (!) 146.3 kg    Examination:  General exam: Appears calm and comfortable  Respiratory system: Clear to auscultation. Respiratory effort  normal. Cardiovascular system: Regular rate and rhythm, no murmurs Gastrointestinal system: Soft, nondistended, no rebound or guarding, plus bowel sounds Central nervous system: Alert and oriented.  Grossly intact, moving all extremities Extremities: 1+ lower extremity edema. Skin: Underneath midline of abdomen noted to have 2 cm wound with extrusion of what appears to be abdominal mesh Psychiatry: Judgement and insight appear normal. Mood & affect appropriate.     Data Reviewed: I have personally reviewed following labs and imaging studies  CBC: Recent Labs  Lab 05/17/18 1237 05/23/18 1031  WBC 7.8 7.7  NEUTROABS  --  4.2  HGB 13.5 15.1*  HCT 41.6 46.0  MCV 93.7 92.6  PLT 354 299   Basic Metabolic Panel: Recent Labs  Lab 05/17/18 1237 05/23/18 1031 05/23/18 1750 05/23/18 2304 05/24/18 0522  NA 137 133* 140 137 135  K 3.8 4.4 3.8 3.2* 3.1*  CL 101 100 111 111 109  CO2 19* 15* 19* 20* 19*  GLUCOSE 293* 556* 155* 142* 195*  BUN 8 13 10 8 8   CREATININE 0.94 0.98 0.70 0.59 0.59  CALCIUM 9.1 10.1 9.0 8.7* 8.6*  MG  --   --  1.9  --   --   PHOS  --   --  1.8*  --   --    GFR: Estimated Creatinine Clearance: 120.9 mL/min (by C-G formula based on SCr of 0.59 mg/dL). Liver Function Tests: Recent Labs  Lab 05/23/18 1031  AST 15  ALT 24  ALKPHOS 83  BILITOT 1.7*  PROT 8.5*  ALBUMIN 4.2   Recent Labs  Lab 05/23/18 1031  LIPASE 31   No results for input(s): AMMONIA in the last 168 hours. Coagulation Profile: No results for input(s): INR, PROTIME in the last 168 hours. Cardiac Enzymes: Recent Labs  Lab 05/24/18 0756  TROPONINI <0.03   BNP (last 3 results) No results for input(s): PROBNP in the last 8760 hours. HbA1C: No results for input(s): HGBA1C in the last 72 hours. CBG: Recent Labs  Lab 05/24/18 0258 05/24/18 0401 05/24/18 0503 05/24/18 0603 05/24/18 0712  GLUCAP 202* 192* 186* 155* 112*   Lipid Profile: No results for input(s): CHOL, HDL,  LDLCALC, TRIG, CHOLHDL, LDLDIRECT in the last 72 hours. Thyroid Function Tests: No results for input(s): TSH, T4TOTAL, FREET4, T3FREE, THYROIDAB in the last 72 hours. Anemia Panel: No results for input(s): VITAMINB12, FOLATE, FERRITIN, TIBC, IRON, RETICCTPCT in the last 72 hours. Sepsis Labs: No results for input(s): PROCALCITON, LATICACIDVEN in the last 168 hours.  Recent Results (from the past 240 hour(s))  MRSA PCR Screening     Status: None   Collection Time: 05/23/18  6:45 PM  Result Value Ref Range Status   MRSA by PCR NEGATIVE NEGATIVE Final    Comment:        The GeneXpert MRSA Assay (FDA approved for NASAL specimens only), is one component of a comprehensive MRSA colonization surveillance program. It is not intended to diagnose MRSA infection nor to guide or monitor treatment for  MRSA infections. Performed at [facility], [address] [place], [place] [address]          Radiology Studies: Dg Chest 2 View  Result Date: 05/23/2018 CLINICAL DATA:  Shortness of breath EXAM: CHEST - 2 VIEW COMPARISON:  05/17/2018 FINDINGS: Cardiac shadows within normal limits. The lungs are well aerated bilaterally. Persistent left basilar infiltrate is noted. No new focal infiltrate or effusion is seen. Degenerative changes of the thoracic spine are noted. IMPRESSION: Stable left basilar infiltrate. Electronically Signed   By: Alcide CleverMark  Lukens M.D.   On: 05/23/2018 12:37        Scheduled Meds: . amLODipine  10 mg Oral Daily  . aspirin EC  81 mg Oral Daily  . enoxaparin (LOVENOX) injection  80 mg Subcutaneous Q24H  . fenofibrate  160 mg Oral Daily  . ferrous sulfate  325 mg Oral Daily  . insulin aspart  10 Units Subcutaneous QHS  . insulin aspart  40 Units Subcutaneous TID WC  . insulin glargine  40 Units Subcutaneous BID  . losartan  25 mg Oral Daily  . pantoprazole  40 mg Oral QHS  . pravastatin  40 mg Oral q1800   Continuous Infusions: . potassium  PHOSPHATE IVPB (in mmol) 30 mmol (05/24/18 0800)     LOS: 0 days    Time spent: 35    Delaine LameShrey C Judee Hennick, MD [facility]  If 7PM-7AM, please contact night-coverage [email or web address] Password [email or web address] 05/24/2018, 9:32 AM

## 2018-05-24 NOTE — Progress Notes (Signed)
Verbal order to turn insulin gtt off now, given lantus now.

## 2018-05-24 NOTE — Consult Note (Signed)
WOC Nurse wound consult note Reason for Consult:NOnhealing surgical wound to abdominal pannus above mons pubis with exposed mesh from hernia repair  Surgery has consulted, will follow up outpatient and ordered dry dressing to this site daily.  No further recommendations.  Wound type:Nonhealing surgical Pressure Injury POA: NA Measurement: 3 cm x 2 cm with 1 cm exposed mesh in wound bed.   Wound bed:See above Drainage (amount, consistency, odor) minimal serosanguinous  Musty odor Periwound:intact  In abdominal pannus Dressing procedure/placement/frequency:Dry dressing daily.  Will not follow at this time.  Please re-consult if needed.  Maple Hudson MSN, RN, FNP-BC CWON Wound, Ostomy, Continence Nurse Pager [phone]

## 2018-05-24 NOTE — Progress Notes (Signed)
  Echocardiogram 2D Echocardiogram has been performed.  Alexandra Elliott 05/24/2018, 3:32 PM

## 2018-05-24 NOTE — Consult Note (Signed)
Cardiology Consultation:   Patient ID: Alexandra Elliott MRN: [id number]; DOB: 1971-03-08  Admit date: 05/23/2018 Date of Consult: 05/24/2018  Primary Care Provider: Wilburn Mylar, MD Primary Cardiologist: No primary care provider on file.  Primary Electrophysiologist:  None    Patient Profile:   Alexandra Elliott is a 48 y.o. female with a hx of hypertension, hyperlipidemia, GERD, diabetes type 2 who is being seen today for the evaluation of asymptomatic VT and exertional shortness of breath at the request of Dr. Clearnce Sorrel.  History of Present Illness:   [name] had a recent hospital admission 1/10-1/04/2019 for community-acquired pneumonia treated with IV Rocephin and azithromycin.  She was discharged on doxycycline.  During that hospitalization her blood sugars were in the 400s.  A1c was noted to be 8.9.  She went back to the ED on 05/17/2018 with complaints of persistent shortness of breath and also had elevated blood sugars.  She did not meet criteria for admission and was discharged.  She was advised that it would take a couple of weeks to get over the pneumonia.  She was sent home on continued azithromycin and Vantin for the next 5 days.  There was plan to repeat chest x-ray in 3-4 weeks to follow-up on pneumonia.  She returned to the [facility] ED yesterday 05/23/2018 with complaints of increasing shortness of breath and cough despite compliance with outpatient antibiotics.  Apparently her symptoms had gotten gradually worse over the prior 48 hours.  She also reported blood sugars greater than 600.  On presentation the patient had elevated blood glucose greater than 500.  Normal renal function.  Potassium initially 4.4 then hypokalemic with potassium 3.1  CTA of the chest done on 05/10/2018 showed no pulmonary embolus, left lower lobe pneumonia.  No mention of coronary artery calcification. Chest x-ray done yesterday shows stable left basilar infiltrate. CT of abdomen/pelvis done today  shows medial left lower lobe pneumonia, fatty infiltration of liver and cholelithiasis.  She is being treated for mild DKA.  Patient apparently had a brief run of nonsustained V. tach last night that was asymptomatic.  Due to this and the patient's continued exertional shortness of breath cardiology has been asked to evaluate.  The patient has had 1negative troponin.  At her last admission, 05/10/18, she had a BNP of only 15.  An echocardiogram has been ordered.  She has a history of being seen in [facility] for atypical chest pain for which she had a Lexiscan Myoview on 08/21/2014 that showed a nearly completely reversible inferior perfusion defect suggesting ischemia.  She then underwent a left heart cath on 08/21/2014 that showed normal coronary arteries and normal hemodynamics.  She has had a normal stress echo on 12/04/2015 with a EF 60%, poor exercise capacity, no evidence of ischemia.   On my examination the patient reports that she is living at the [facility] with her son and husband after having to move out of the [place] when the weekly price went up. She is havign DOE walking 20 feet and even going into the bathroom. She looks remarkably well. Her exam is unremarkable. Prior to the pneumonia she was having no chest discomfort or shortness of breath. She is currently having chest pain only with coughing. She is having some dizziness when she is up walking, just since the PNA. Prior to that she has had no palpitations, dizziness or syncope.   She reports that when she was first brought to the hospital on 05/10/18 her  insulin pens were with EMS and then she has not seen them since. She has been without insulin since then.   Past Medical History:  Diagnosis Date  . Diabetes mellitus without complication (HCC)   . GERD (gastroesophageal reflux disease)   . High cholesterol   . HLD (hyperlipidemia) 05/23/2018  . Hypertension     Past Surgical History:  Procedure Laterality  Date  . DILATION AND CURETTAGE OF UTERUS    . HERNIA REPAIR       Home Medications:  Prior to Admission medications   Medication Sig Start Date End Date Taking? Authorizing Provider  albuterol (PROVENTIL HFA;VENTOLIN HFA) 108 (90 Base) MCG/ACT inhaler Inhale 2 puffs into the lungs every 4 (four) hours as needed for wheezing or shortness of breath. 05/12/18  Yes Smith, Rondell A, MD  amLODipine (NORVASC) 10 MG tablet Take 10 mg by mouth daily.   Yes [provider]  aspirin EC 81 MG EC tablet Take 1 tablet (81 mg total) by mouth daily. 05/13/18  Yes Madelyn Flavors A, MD  fenofibrate 160 MG tablet Take 160 mg by mouth daily.   Yes [provider]  ferrous sulfate 325 (65 FE) MG EC tablet Take 325 mg by mouth daily.   Yes [provider]  furosemide (LASIX) 20 MG tablet Take 1 tablet (20 mg total) by mouth daily. 05/12/18 05/12/19 Yes Clydie Braun, MD  hydrOXYzine (ATARAX/VISTARIL) 25 MG tablet Take 25 mg by mouth 3 (three) times daily as needed for anxiety.    Yes [provider]  Insulin Glargine, 2 Unit Dial, (TOUJEO MAX SOLOSTAR) 300 UNIT/ML SOPN Inject 40 Units into the skin daily. Patient taking differently: Inject 40 Units into the skin 2 (two) times daily.  05/12/18  Yes Smith, Arn Medal A, MD  insulin lispro (HUMALOG KWIKPEN) 100 UNIT/ML KwikPen Inject 0.25 mLs (25 Units total) into the skin See admin instructions. Inject 25 units with each meal, and 10 units with a snack at night. Patient taking differently: Inject 40 Units into the skin See admin instructions. Inject 40 units with each meal, and 10 units with a snack at night. 05/12/18  Yes Madelyn Flavors A, MD  losartan (COZAAR) 25 MG tablet Take 1 tablet (25 mg total) by mouth daily. 05/12/18  Yes Smith, Arn Medal A, MD  lovastatin (MEVACOR) 40 MG tablet Take 40 mg by mouth at bedtime.   Yes [provider]  pantoprazole (PROTONIX) 40 MG tablet Take 40 mg by mouth at bedtime.    Yes [provider]    Inpatient Medications: Scheduled Meds: . amLODipine  10 mg Oral Daily  . aspirin EC  81 mg Oral Daily  . enoxaparin (LOVENOX) injection  70 mg Subcutaneous Q24H  . fenofibrate  160 mg Oral Daily  . ferrous sulfate  325 mg Oral Daily  . insulin aspart  10 Units Subcutaneous QHS  . insulin aspart  40 Units Subcutaneous TID WC  . insulin glargine  40 Units Subcutaneous BID  . losartan  25 mg Oral Daily  . pantoprazole  40 mg Oral QHS  . pravastatin  40 mg Oral q1800  . sodium chloride (PF)       Continuous Infusions:  PRN Meds: acetaminophen **OR** acetaminophen, hydrOXYzine, iohexol, ondansetron **OR** ondansetron (ZOFRAN) IV, polyethylene glycol, traMADol  Allergies:    Allergies  Allergen Reactions  . Levofloxacin Other (See Comments)    Loss of hearing  . Lisinopril Swelling    Had lip swelling but determined  not to be related to medication    Social History:   Social History   Socioeconomic History  . Marital status: Single    Spouse name: Not on file  . Number of children: Not on file  . Years of education: Not on file  . Highest education level: Not on file  Occupational History  . Not on file  Social Needs  . Financial resource strain: Not on file  . Food insecurity:    Worry: Not on file    Inability: Not on file  . Transportation needs:    Medical: Not on file    Non-medical: Not on file  Tobacco Use  . Smoking status: Former Games developermoker  . Smokeless tobacco: Never Used  Substance and Sexual Activity  . Alcohol use: Never    Frequency: Never  . Drug use: Never  . Sexual activity: Not Currently  Lifestyle  . Physical activity:    Days per week: Not on file    Minutes per session: Not on file  . Stress: Not on file  Relationships  . Social connections:    Talks on phone: Not on file    Gets together: Not on file    Attends religious service: Not on file    Active member of club or organization: Not on file    Attends meetings of  clubs or organizations: Not on file    Relationship status: Not on file  . Intimate partner violence:    Fear of current or ex partner: Not on file    Emotionally abused: Not on file    Physically abused: Not on file    Forced sexual activity: Not on file  Other Topics Concern  . Not on file  Social History Narrative  . Not on file    Family History:    Family History  Problem Relation Age of Onset  . Diabetes Mellitus II Mother   . Diabetes Mellitus II Father   . Diabetes Mellitus I Brother   . Kidney disease Brother      ROS:  Please see the history of present illness.   All other ROS reviewed and negative.     Physical Exam/Data:   Vitals:   05/24/18 0700 05/24/18 0800 05/24/18 0824 05/24/18 1010  BP:   (!) 126/55 (!) 132/48  Pulse:   (!) 101   Resp: (!) 22  18   Temp:  98.1 F (36.7 C)    TempSrc:  Oral    SpO2:   100%   Weight:      Height:        Intake/Output Summary (Last 24 hours) at 05/24/2018 1509 Last data filed at 05/24/2018 0700 Gross per 24 hour  Intake 2268.51 ml  Output 400 ml  Net 1868.51 ml   Last 3 Weights 05/24/2018 05/23/2018 05/11/2018  Weight (lbs) 322 lb 8.5 oz 340 lb 335 lb 12.2 oz  Weight (kg) 146.3 kg 154.223 kg 152.3 kg     Body mass index is 57.13 kg/m.  General:  Obese female, in no acute distress HEENT: normal Lymph: no adenopathy Neck: no JVD Endocrine:  No thryomegaly Vascular: No carotid bruits; FA pulses 2+ bilaterally without bruits  Cardiac:  normal S1, S2; RRR; no murmur  Lungs:  clear to auscultation bilaterally, no wheezing, rhonchi or rales  Abd: soft, nontender, no hepatomegaly  Ext: no edema Musculoskeletal:  No deformities, BUE and BLE strength normal and equal Skin: warm and dry  Neuro:  CNs 2-12  intact, no focal abnormalities noted Psych:  Normal affect   EKG:  The EKG was personally reviewed and demonstrates:  Sinus tachycardia, 114 bpm, Right atrial enlargement, Low voltage- precordial leads Telemetry:   Telemetry was personally reviewed and demonstrates:  NSR in the 90's with no ectopy except for 9 beats of WCT at 18:28 yesterday  Relevant CV Studies:  Echocardiogram pending  [facility] heart [facility] [place] INDICATIONS: Angina/MI: atypical chest pain, CCS class III. Abnormal stress test. IMPRESSIONS: The coronary anatomy is normal with right dominant system. HEMODYNAMICS: Hemodynamic assessment demonstrates normal hemodynamics. VENTRICLES: Left ventricular regional wall motion was not assessed. CORONARY CIRCULATION: The coronary circulation is right dominant. Left main: Normal. LAD: Normal. Circumflex: Normal. RCA: Normal.  Exercise stress echo 12/04/2015 Summary Technically adequate study. Definity contrast was used for better visualization of the endocardium Poor exercise capacity Normal resting left ventricle with no regional wall motion abnormality LV ejection fraction estimated of 60% Hyperdynamic response to exercise with no regional wall motion abnormality No clinical symptoms EKG changes or echocardiographic evidence of ischemia on this study Normal exercise stress echo with no evidence of ischemia  Laboratory Data:  Chemistry Recent Labs  Lab 05/23/18 1750 05/23/18 2304 05/24/18 0522  NA 140 137 135  K 3.8 3.2* 3.1*  CL 111 111 109  CO2 19* 20* 19*  GLUCOSE 155* 142* 195*  BUN 10 8 8   CREATININE 0.70 0.59 0.59  CALCIUM 9.0 8.7* 8.6*  GFRNONAA >60 >60 >60  GFRAA >60 >60 >60  ANIONGAP 10 6 7     Recent Labs  Lab 05/23/18 1031  PROT 8.5*  ALBUMIN 4.2  AST 15  ALT 24  ALKPHOS 83  BILITOT 1.7*   Hematology Recent Labs  Lab 05/23/18 1031  WBC 7.7  RBC 4.97  HGB 15.1*  HCT 46.0  MCV 92.6  MCH 30.4  MCHC 32.8  RDW 12.2  PLT 299   Cardiac Enzymes Recent Labs  Lab 05/24/18 0756  TROPONINI <0.03   No results for input(s): TROPIPOC in the last 168 hours.  BNPNo results for input(s):  BNP, PROBNP in the last 168 hours.  DDimer No results for input(s): DDIMER in the last 168 hours.  Radiology/Studies:  Dg Chest 2 View  Result Date: 05/23/2018 CLINICAL DATA:  Shortness of breath EXAM: CHEST - 2 VIEW COMPARISON:  05/17/2018 FINDINGS: Cardiac shadows within normal limits. The lungs are well aerated bilaterally. Persistent left basilar infiltrate is noted. No new focal infiltrate or effusion is seen. Degenerative changes of the thoracic spine are noted. IMPRESSION: Stable left basilar infiltrate. Electronically Signed   By: Alcide CleverMark  Lukens M.D.   On: 05/23/2018 12:37   Ct Abdomen Pelvis W Contrast  Result Date: 05/24/2018 CLINICAL DATA:  Umbilical and epigastric pain yesterday. EXAM: CT ABDOMEN AND PELVIS WITH CONTRAST TECHNIQUE: Multidetector CT imaging of the abdomen and pelvis was performed using the standard protocol following bolus administration of intravenous contrast. CONTRAST:  100mL OMNIPAQUE IOHEXOL 300 MG/ML  SOLN COMPARISON:  November 04, 2017 FINDINGS: Lower chest: Consolidation of medial left lower lobe is identified. The heart size is enlarged. Hepatobiliary: Diffuse low density of the liver is identified without focal liver lesion. Small gallstones identified in the gallbladder. No inflammation is noted around gallbladder. The biliary ducts are normal. Pancreas: Unremarkable. No pancreatic ductal dilatation or surrounding inflammatory changes. Spleen: Normal in size without focal abnormality. Adrenals/Urinary Tract: The bilateral adrenal glands are normal. There is no nephrolithiasis  or hydronephrosis bilaterally. Low-density lesion is identified in the posterior midpole left kidney unchanged. The bladder is normal. Stomach/Bowel: Stomach is within normal limits. Appendix appears normal. No evidence of bowel wall thickening, distention, or inflammatory changes. Vascular/Lymphatic: No significant vascular findings are present. No enlarged abdominal or pelvic lymph nodes.  Reproductive: Status post hysterectomy. No adnexal masses. 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 Musculoskeletal: Degenerative joint changes of the spine are noted. IMPRESSION: Medial left lower lobe pneumonia. No acute abnormality identified in the abdomen pelvis. Fatty infiltration of liver. Cholelithiasis. Electronically Signed   By: Sherian Rein M.D.   On: 05/24/2018 13:44    Assessment and Plan:   1. NSVT -Pt had 9 beats of WCT at 18:28 yesterday, otherwise she is in NSR with no ectopy. She was asymptomatic.  -She's had weakness and dizziness associated with PNA. Prior to PNA she had no dizziness, palpitations or syncope.  -She was hypokalemic at the time with K+ 3.1. Replace K+.  -I do not see anything outside of the hypokalemia that is concerning. Echo has been done and is pending results. If Normal EF and no WMA, would not recommend any further cardiac evaluation.   2. Shortness of breath -Patient has been recently hospitalized for community-acquired pneumonia 1/10-1/04/2019 treated with IV Rocephin and azithromycin.  She was discharged on doxycycline. -She continues to have shortness of breath despite treatment -Patient has lower extremity edema -Echocardiogram pending -She has a history of normal coronary arteries by cath in 2016.  Normal stress echo in 2017. -Her DOE is most likely related to PNA. Would reassess after this resolves and pursue further evaluation if DOE persists.   3.  Mild DKA -Patient has had elevated blood sugars for the duration of her illness.  Currently admitted with blood sugar greater than 500. -She has been treated with IV insulin and hydration, now on her home insulin regimen. -Of note the patient has been without home insulin since her hospitalization on 1/10. She will need a new supply.   4.  Hypertension -Home management includes amlodipine 10 mg, losartan 25 mg -BP currently well controlled.   5.  Hyperlipidemia -On  lovastatin 40 mg daily and fenofibrate 160 mg daily.  Last lipid profile in care everywhere on 06/09/2016 showed LDL of 81, triglycerides 94, TC 151.     For questions or updates, please contact [facility] Please consult [email or web address] for contact info under     Signed, Berton Bon, NP  05/24/2018 3:09 PM   History and all data above reviewed.  Patient examined.  I agree with the findings as above.  The patient reports that she has not been breathing well since October.  As above she has been treated recently with antibiotics for upper respiratory infection but she says she is not getting better.  She says she is dyspneic with mild exertion.  She is not describing PND or orthopnea.  She did have some cough and fevers.  She is not describing chest pressure, neck or arm discomfort.  Her heart goes fast when she does stumble but she does not notice any palpitations in particular.  She is had a BNP not long ago that was normal.  Echocardiogram is pending.  She had a cardiac catheterization 2016 without any coronary disease.  Chest x-ray is not suggestive edema.  We are called because of the dyspnea and because of her run of nonsustained ventricular tachycardia.  The patient exam reveals COR:RRR  ,  Lungs: Clear  ,  Abd: Positive bowel sounds, no rebound no guarding , Ext No edema  .  All available labs, radiology testing, previous records reviewed. Agree with documented assessment and plan. DYSPNEA: Chest x-ray does suggest left basilar infiltrate.  There is no overt evidence of heart failure as an etiology.  Echo is pending.  I suspect this is multifactorial but I am not suspecting an ischemic etiology or heart failure etiology.  Further management will be per the primary service. NSVT: Patient had an asymptomatic run of nonsustained ventricular tachycardia.  Potassium was low at that point and has been supplemented.  There have been no other significant dysrhythmias and no symptoms.  I doubt she will  need further work-up pending the results of the echo.    Fayrene Fearing Jerzi Tigert  5:34 PM  05/24/2018

## 2018-05-24 NOTE — Consult Note (Addendum)
Reason for Consult: Exposed abdominal hernia repair mesh  referring Physician: Arlyn Leak  Chief complaint emesis and abdominal pain  Alexandra Elliott is an 48 y.o. female.   HPI: Patient is a 48 year old female with a history of type 1 diabetes, hypertension, reflux and recent admission for community-acquired pneumonia who presented with abdominal pain and emesis.  She presented with intermittent episodes of shortness of breath and cough on 05/17/2018 diagnosed with pneumonia and discharged.  She has had issues with maintaining her her insulin, due to cost.  She was admitted with DKA on 05/23/2017.  Admission work-up showed she is afebrile and her vital signs are stable.  Admission labs showed a pH of 7.27, glucose of 556, electrolytes were initially stable her potassium is down some this a.m.  Urinalysis was normal.    Nursing staff subsequently found a 2 cm site below her pannus in the midline, this appears to be mesh from a prior hernia repair in [place].  Patient reports she had what sounds like an incarcerated hernia with small bowel infarction in 2001.  This was done in [place].  Postop she had wound care issues and was followed at a wound care facility.  She may have had a wound VAC during that hospitalization.  It sounds like it healed but sometime after that she developed this area that is sore and open.  Because of her size I doubt if she has been able to see it..  She thinks is been there for many years.  She works to keep it clean and dry.  But she has had pain from the site for many years.  She has no history of cellulitis or infection in this area despite what looks like mesh sticking out.  Patient reports she was about 315 pounds and thinks she is around 350 pounds now.  We are asked to see.  Past Medical History:  Diagnosis Date  . Diabetes mellitus without complication (HCC)   . GERD (gastroesophageal reflux disease)   . High cholesterol   . HLD (hyperlipidemia) 05/23/2018   . Hypertension     Past Surgical History:  Procedure Laterality Date  . DILATION AND CURETTAGE OF UTERUS    . HERNIA REPAIR      Family History  Problem Relation Age of Onset  . Diabetes Mellitus II Mother   . Diabetes Mellitus II Father   . Diabetes Mellitus I Brother   . Kidney disease Brother     Social History:  reports that she has quit smoking. She has never used smokeless tobacco. She reports that she does not drink alcohol or use drugs.  Allergies:  Allergies  Allergen Reactions  . Levofloxacin Other (See Comments)    Loss of hearing  . Lisinopril Swelling    Had lip swelling but determined not to be related to medication    Medications:  Prior to Admission:  Medications Prior to Admission  Medication Sig Dispense Refill Last Dose  . albuterol (PROVENTIL HFA;VENTOLIN HFA) 108 (90 Base) MCG/ACT inhaler Inhale 2 puffs into the lungs every 4 (four) hours as needed for wheezing or shortness of breath. 1 Inhaler 0 05/23/2018 at Unknown time  . amLODipine (NORVASC) 10 MG tablet Take 10 mg by mouth daily.   05/23/2018 at Unknown time  . aspirin EC 81 MG EC tablet Take 1 tablet (81 mg total) by mouth daily. 30 tablet 0 05/23/2018 at Unknown time  . fenofibrate 160 MG tablet Take 160 mg by mouth daily.  05/23/2018 at Unknown time  . ferrous sulfate 325 (65 FE) MG EC tablet Take 325 mg by mouth daily.   05/23/2018 at Unknown time  . furosemide (LASIX) 20 MG tablet Take 1 tablet (20 mg total) by mouth daily. 30 tablet 0 05/22/2018 at Unknown time  . hydrOXYzine (ATARAX/VISTARIL) 25 MG tablet Take 25 mg by mouth 3 (three) times daily as needed for anxiety.    05/23/2018 at Unknown time  . Insulin Glargine, 2 Unit Dial, (TOUJEO MAX SOLOSTAR) 300 UNIT/ML SOPN Inject 40 Units into the skin daily. (Patient taking differently: Inject 40 Units into the skin 2 (two) times daily. ) 4 mL 0 05/12/2018  . insulin lispro (HUMALOG KWIKPEN) 100 UNIT/ML KwikPen Inject 0.25 mLs (25 Units total) into  the skin See admin instructions. Inject 25 units with each meal, and 10 units with a snack at night. (Patient taking differently: Inject 40 Units into the skin See admin instructions. Inject 40 units with each meal, and 10 units with a snack at night.) 15 mL 1 05/12/2018  . losartan (COZAAR) 25 MG tablet Take 1 tablet (25 mg total) by mouth daily. 30 tablet 0 05/23/2018 at Unknown time  . lovastatin (MEVACOR) 40 MG tablet Take 40 mg by mouth at bedtime.   05/22/2018 at Unknown time  . pantoprazole (PROTONIX) 40 MG tablet Take 40 mg by mouth at bedtime.    05/22/2018 at Unknown time   Scheduled: . amLODipine  10 mg Oral Daily  . aspirin EC  81 mg Oral Daily  . enoxaparin (LOVENOX) injection  80 mg Subcutaneous Q24H  . fenofibrate  160 mg Oral Daily  . ferrous sulfate  325 mg Oral Daily  . insulin aspart  10 Units Subcutaneous QHS  . insulin aspart  40 Units Subcutaneous TID WC  . insulin glargine  40 Units Subcutaneous BID  . losartan  25 mg Oral Daily  . pantoprazole  40 mg Oral QHS  . pravastatin  40 mg Oral q1800   Continuous: . potassium PHOSPHATE IVPB (in mmol) 30 mmol (05/24/18 0800)   Anti-infectives (From admission, onward)   None      Results for orders placed or performed during the hospital encounter of 05/23/18 (from the past 48 hour(s))  CBG monitoring, ED     Status: Abnormal   Collection Time: 05/23/18  9:43 AM  Result Value Ref Range   Glucose-Capillary 493 (H) 70 - 99 mg/dL   Comment 1 Notify RN    Comment 2 Document in Chart   Blood gas, venous     Status: Abnormal   Collection Time: 05/23/18 10:30 AM  Result Value Ref Range   FIO2 21.00    Delivery systems ROOM AIR    pH, Ven 7.271 7.250 - 7.430   pCO2, Ven 35.6 (L) 44.0 - 60.0 mmHg   pO2, Ven 44.0 32.0 - 45.0 mmHg   Bicarbonate 15.8 (L) 20.0 - 28.0 mmol/L   Acid-base deficit 9.9 (H) 0.0 - 2.0 mmol/L   O2 Saturation 74.2 %   Patient temperature 98.6    Collection site VEIN    Drawn by RN    Sample type  VEIN     Comment: Performed at [facility], [address] [place], [place] [address]  CBC with Differential     Status: Abnormal   Collection Time: 05/23/18 10:31 AM  Result Value Ref Range   WBC 7.7 4.0 - 10.5 K/uL   RBC 4.97 3.87 - 5.11 MIL/uL  Hemoglobin 15.1 (H) 12.0 - 15.0 g/dL   HCT 78.246.0 95.636.0 - 21.346.0 %   MCV 92.6 80.0 - 100.0 fL   MCH 30.4 26.0 - 34.0 pg   MCHC 32.8 30.0 - 36.0 g/dL   RDW 08.612.2 57.811.5 - 46.915.5 %   Platelets 299 150 - 400 K/uL   nRBC 0.0 0.0 - 0.2 %   Neutrophils Relative % 55 %   Neutro Abs 4.2 1.7 - 7.7 K/uL   Lymphocytes Relative 31 %   Lymphs Abs 2.4 0.7 - 4.0 K/uL   Monocytes Relative 11 %   Monocytes Absolute 0.9 0.1 - 1.0 K/uL   Eosinophils Relative 2 %   Eosinophils Absolute 0.1 0.0 - 0.5 K/uL   Basophils Relative 1 %   Basophils Absolute 0.1 0.0 - 0.1 K/uL   Immature Granulocytes 0 %   Abs Immature Granulocytes 0.01 0.00 - 0.07 K/uL    Comment: Performed at [facility], [address] [place] [address] [place], [place] [address]  Comprehensive metabolic panel     Status: Abnormal   Collection Time: 05/23/18 10:31 AM  Result Value Ref Range   Sodium 133 (L) 135 - 145 mmol/L   Potassium 4.4 3.5 - 5.1 mmol/L   Chloride 100 98 - 111 mmol/L   CO2 15 (L) 22 - 32 mmol/L   Glucose, Bld 556 (HH) 70 - 99 mg/dL    Comment: CRITICAL RESULT CALLED TO, READ BACK BY AND VERIFIED WITH: BINGHAM,S. RN AT 1126 05/23/18 MULLINS,T    BUN 13 6 - 20 mg/dL   Creatinine, Ser 8.410.98 0.44 - 1.00 mg/dL   Calcium 32.410.1 8.9 - 40.110.3 mg/dL   Total Protein 8.5 (H) 6.5 - 8.1 g/dL   Albumin 4.2 3.5 - 5.0 g/dL   AST 15 15 - 41 U/L   ALT 24 0 - 44 U/L   Alkaline Phosphatase 83 38 - 126 U/L   Total Bilirubin 1.7 (H) 0.3 - 1.2 mg/dL   GFR calc non Af Amer >60 >60 mL/min   GFR calc Af Amer >60 >60 mL/min   Anion gap 18 (H) 5 - 15    Comment: Performed at [facility], [address] [place], [place] [address]  Urinalysis, Routine w reflex  microscopic     Status: Abnormal   Collection Time: 05/23/18 10:31 AM  Result Value Ref Range   Color, Urine STRAW (A) YELLOW   APPearance CLEAR CLEAR   Specific Gravity, Urine 1.032 (H) 1.005 - 1.030   pH 5.0 5.0 - 8.0   Glucose, UA >=500 (A) NEGATIVE mg/dL   Hgb urine dipstick NEGATIVE NEGATIVE   Bilirubin Urine NEGATIVE NEGATIVE   Ketones, ur 80 (A) NEGATIVE mg/dL   Protein, ur NEGATIVE NEGATIVE mg/dL   Nitrite NEGATIVE NEGATIVE   Leukocytes, UA NEGATIVE NEGATIVE   RBC / HPF 0-5 0 - 5 RBC/hpf   WBC, UA 0-5 0 - 5 WBC/hpf   Bacteria, UA RARE (A) NONE SEEN   Squamous Epithelial / LPF 0-5 0 - 5   Mucus PRESENT     Comment: Performed at [facility], [address] [place], [place] [address]  Lipase, blood     Status: None   Collection Time: 05/23/18 10:31 AM  Result Value Ref Range   Lipase 31 11 - 51 U/L    Comment: Performed at [facility], [address] [place], [place] [address]  CBG monitoring, ED     Status: Abnormal  Collection Time: 05/23/18 12:38 PM  Result Value Ref Range   Glucose-Capillary 415 (H) 70 - 99 mg/dL  CBG monitoring, ED     Status: Abnormal   Collection Time: 05/23/18  1:33 PM  Result Value Ref Range   Glucose-Capillary 430 (H) 70 - 99 mg/dL  CBG monitoring, ED     Status: Abnormal   Collection Time: 05/23/18  2:42 PM  Result Value Ref Range   Glucose-Capillary 232 (H) 70 - 99 mg/dL  CBG monitoring, ED     Status: Abnormal   Collection Time: 05/23/18  3:51 PM  Result Value Ref Range   Glucose-Capillary 209 (H) 70 - 99 mg/dL  Glucose, capillary     Status: Abnormal   Collection Time: 05/23/18  5:46 PM  Result Value Ref Range   Glucose-Capillary 173 (H) 70 - 99 mg/dL  Basic metabolic panel     Status: Abnormal   Collection Time: 05/23/18  5:50 PM  Result Value Ref Range   Sodium 140 135 - 145 mmol/L    Comment: DELTA CHECK NOTED REPEATED TO VERIFY    Potassium 3.8 3.5 - 5.1 mmol/L   Chloride 111 98 -  111 mmol/L   CO2 19 (L) 22 - 32 mmol/L   Glucose, Bld 155 (H) 70 - 99 mg/dL   BUN 10 6 - 20 mg/dL   Creatinine, Ser 0.35 0.44 - 1.00 mg/dL   Calcium 9.0 8.9 - 46.5 mg/dL   GFR calc non Af Amer >60 >60 mL/min   GFR calc Af Amer >60 >60 mL/min   Anion gap 10 5 - 15    Comment: Performed at [facility], [address], [place], [place] [address]  Magnesium     Status: None   Collection Time: 05/23/18  5:50 PM  Result Value Ref Range   Magnesium 1.9 1.7 - 2.4 mg/dL    Comment: Performed at [facility], [address], [place], [place] [address]  Phosphorus     Status: Abnormal   Collection Time: 05/23/18  5:50 PM  Result Value Ref Range   Phosphorus 1.8 (L) 2.5 - 4.6 mg/dL    Comment: Performed at [facility] [place], [address], [place], [place] [address]  Glucose, capillary     Status: Abnormal   Collection Time: 05/23/18  6:41 PM  Result Value Ref Range   Glucose-Capillary 150 (H) 70 - 99 mg/dL  MRSA PCR Screening     Status: None   Collection Time: 05/23/18  6:45 PM  Result Value Ref Range   MRSA by PCR NEGATIVE NEGATIVE    Comment:        The GeneXpert MRSA Assay (FDA approved for NASAL specimens only), is one component of a comprehensive MRSA colonization surveillance program. It is not intended to diagnose MRSA infection nor to guide or monitor treatment for MRSA infections. Performed at [facility], [place], [address] [place], [place] [address]   Glucose, capillary     Status: Abnormal   Collection Time: 05/23/18  7:43 PM  Result Value Ref Range   Glucose-Capillary 188 (H) 70 - 99 mg/dL  Glucose, capillary     Status: Abnormal   Collection Time: 05/23/18  8:54 PM  Result Value Ref Range   Glucose-Capillary 229 (H) 70 - 99 mg/dL  Glucose, capillary     Status: Abnormal   Collection Time: 05/23/18 10:04 PM  Result Value Ref Range   Glucose-Capillary 156 (H) 70 - 99  mg/dL  Basic metabolic  panel     Status: Abnormal   Collection Time: 05/23/18 11:04 PM  Result Value Ref Range   Sodium 137 135 - 145 mmol/L   Potassium 3.2 (L) 3.5 - 5.1 mmol/L   Chloride 111 98 - 111 mmol/L   CO2 20 (L) 22 - 32 mmol/L   Glucose, Bld 142 (H) 70 - 99 mg/dL   BUN 8 6 - 20 mg/dL   Creatinine, Ser 8.290.59 0.44 - 1.00 mg/dL   Calcium 8.7 (L) 8.9 - 10.3 mg/dL   GFR calc non Af Amer >60 >60 mL/min   GFR calc Af Amer >60 >60 mL/min   Anion gap 6 5 - 15    Comment: Performed at [facility], [address] [place], [place] [address]  Glucose, capillary     Status: Abnormal   Collection Time: 05/23/18 11:08 PM  Result Value Ref Range   Glucose-Capillary 133 (H) 70 - 99 mg/dL  Glucose, capillary     Status: Abnormal   Collection Time: 05/24/18 12:02 AM  Result Value Ref Range   Glucose-Capillary 130 (H) 70 - 99 mg/dL  Glucose, capillary     Status: Abnormal   Collection Time: 05/24/18  1:02 AM  Result Value Ref Range   Glucose-Capillary 127 (H) 70 - 99 mg/dL  Glucose, capillary     Status: Abnormal   Collection Time: 05/24/18  2:01 AM  Result Value Ref Range   Glucose-Capillary 166 (H) 70 - 99 mg/dL  Glucose, capillary     Status: Abnormal   Collection Time: 05/24/18  2:58 AM  Result Value Ref Range   Glucose-Capillary 202 (H) 70 - 99 mg/dL  Glucose, capillary     Status: Abnormal   Collection Time: 05/24/18  4:01 AM  Result Value Ref Range   Glucose-Capillary 192 (H) 70 - 99 mg/dL  Glucose, capillary     Status: Abnormal   Collection Time: 05/24/18  5:03 AM  Result Value Ref Range   Glucose-Capillary 186 (H) 70 - 99 mg/dL  Basic metabolic panel     Status: Abnormal   Collection Time: 05/24/18  5:22 AM  Result Value Ref Range   Sodium 135 135 - 145 mmol/L   Potassium 3.1 (L) 3.5 - 5.1 mmol/L   Chloride 109 98 - 111 mmol/L   CO2 19 (L) 22 - 32 mmol/L   Glucose, Bld 195 (H) 70 - 99 mg/dL   BUN 8 6 - 20 mg/dL   Creatinine, Ser 0.860.59 0.44 - 1.00 mg/dL   Calcium 8.6  (L) 8.9 - 10.3 mg/dL   GFR calc non Af Amer >60 >60 mL/min   GFR calc Af Amer >60 >60 mL/min   Anion gap 7 5 - 15    Comment: Performed at [facility], [address] [place], [place] [address]  Glucose, capillary     Status: Abnormal   Collection Time: 05/24/18  6:03 AM  Result Value Ref Range   Glucose-Capillary 155 (H) 70 - 99 mg/dL  Glucose, capillary     Status: Abnormal   Collection Time: 05/24/18  7:12 AM  Result Value Ref Range   Glucose-Capillary 112 (H) 70 - 99 mg/dL    Dg Chest 2 View  Result Date: 05/23/2018 CLINICAL DATA:  Shortness of breath EXAM: CHEST - 2 VIEW COMPARISON:  05/17/2018 FINDINGS: Cardiac shadows within normal limits. The lungs are well aerated bilaterally. Persistent left basilar infiltrate is noted. No new focal infiltrate  or effusion is seen. Degenerative changes of the thoracic spine are noted. IMPRESSION: Stable left basilar infiltrate. Electronically Signed   By: Alcide Clever M.D.   On: 05/23/2018 12:37    Review of Systems  All other systems reviewed and are negative.  Blood pressure 110/63, pulse 95, temperature 98.1 F (36.7 C), temperature source Oral, resp. rate (!) 22, height 5\' 3"  (1.6 m), weight (!) 146.3 kg, last menstrual period 05/03/2018, SpO2 95 %. Physical Exam  Constitutional: She is oriented to person, place, and time.  Morbidly obese female in no acute distress.  Dynamically stable.  Glucoses are also stabilized.  HENT:  Head: Normocephalic and atraumatic.  Mouth/Throat: Oropharynx is clear and moist.  Eyes: Right eye exhibits no discharge. Left eye exhibits no discharge. No scleral icterus.  Pupils are equal  Neck: Normal range of motion. Neck supple. No JVD present. No tracheal deviation present. No thyromegaly present.  Cardiovascular: Normal rate, regular rhythm, normal heart sounds and intact distal pulses.  No murmur heard. Respiratory: Breath sounds normal. No respiratory distress. She has no wheezes.  She has no rales. She exhibits no tenderness.  GI: Soft. Bowel sounds are normal. She exhibits no distension and no mass. There is no abdominal tenderness. There is no rebound and no guarding.  She has had ongoing discomfort under her pannus for many years.  See the picture below.  Open site with about it 1.5-2 centimeter of visible mesh.  Musculoskeletal:        General: No tenderness or edema.  Lymphadenopathy:    She has no cervical adenopathy.  Neurological: She is alert and oriented to person, place, and time. No cranial nerve deficit.  Skin: Skin is warm and dry. No rash noted. No erythema. No pallor.  Psychiatric: She has a normal mood and affect. Her behavior is normal. Judgment and thought content normal.       Assessment/Plan: Open abdominal wound with what looks like mesh visible -patient reports this is been present for many years. DKA - type 1 diabetes Hypertension  GERD Hyperlipidemia Morbid obesity BMI 57.13  Plan: Patient was seen and evaluated by Dr. Carolynne Edouard.  We will plan to get a CT of the abdomen.  This is been present for many years so she can follow-up in the office as an outpatient after her current issues are completely resolved.  Continue local wound care keep a dressing over the site.  Lillis Nuttle 05/24/2018, 8:23 AM

## 2018-05-24 NOTE — Progress Notes (Signed)
Inpatient Diabetes Program Recommendations  AACE/ADA: New Consensus Statement on Inpatient Glycemic Control (2015)  Target Ranges:  Prepandial:   less than 140 mg/dL      Peak postprandial:   less than 180 mg/dL (1-2 hours)      Critically ill patients:  140 - 180 mg/dL   Lab Results  Component Value Date   GLUCAP 314 (H) 05/24/2018   HGBA1C 8.9 (H) 05/11/2018    Review of Glycemic Control  Diabetes history: DM2 Outpatient Diabetes medications: Lantus 40 units bid, Humalog 40 units tidwc and 10 units QHS Current orders for Inpatient glycemic control: Lantus 40 units bid, Novolog 40 units tidwc +10 units QHS  HgbA1C - 8.9% - uncontrolled Will need assistance with obtaining insulin in OP setting. Prior to infection, pt was on Toujeo 40 units QD and Humalog 25 units tidwc. MD increased insulin with infection.  Inpatient Diabetes Program Recommendations:     Decrease Novolog to 25 units tidwc Add Novolog 0-15 units tidwc and hs  Case manager consult for assistance with meds at discharge. Pt states she can't get her insulin until her insurance card comes in.   Will continue to follow.  Thank you. Ailene Ards, RD, LDN, CDE Inpatient Diabetes Coordinator [phone]

## 2018-05-24 NOTE — Progress Notes (Signed)
Received and oriented pt to room. Voicing no complaints at present. Melton Alar, RN

## 2018-05-25 DIAGNOSIS — E1010 Type 1 diabetes mellitus with ketoacidosis without coma: Secondary | ICD-10-CM | POA: Diagnosis not present

## 2018-05-25 DIAGNOSIS — R112 Nausea with vomiting, unspecified: Secondary | ICD-10-CM | POA: Diagnosis not present

## 2018-05-25 DIAGNOSIS — R0602 Shortness of breath: Secondary | ICD-10-CM

## 2018-05-25 LAB — BASIC METABOLIC PANEL
Anion gap: 9 (ref 5–15)
CO2: 20 mmol/L — ABNORMAL LOW (ref 22–32)
Calcium: 8.7 mg/dL — ABNORMAL LOW (ref 8.9–10.3)
Chloride: 104 mmol/L (ref 98–111)
GFR calc Af Amer: >60 mL/min (ref >60)
GFR calc non Af Amer: >60 mL/min (ref >60)
Glucose, Bld: 293 mg/dL — ABNORMAL HIGH (ref 70–99)
Potassium: 4 mmol/L (ref 3.5–5.1)
Sodium: 133 mmol/L — ABNORMAL LOW (ref 135–145)

## 2018-05-25 LAB — BASIC METABOLIC PANEL WITH GFR
BUN: 7 mg/dL (ref 6–20)
Creatinine, Ser: 0.68 mg/dL (ref 0.44–1.00)

## 2018-05-25 LAB — GLUCOSE, CAPILLARY
Glucose-Capillary: 256 mg/dL — ABNORMAL HIGH (ref 70–99)
Glucose-Capillary: 302 mg/dL — ABNORMAL HIGH (ref 70–99)
Glucose-Capillary: 344 mg/dL — ABNORMAL HIGH (ref 70–99)

## 2018-05-25 LAB — CBC
HCT: 38.1 % (ref 36.0–46.0)
Hemoglobin: 12.5 g/dL (ref 12.0–15.0)
MCH: 30.1 pg (ref 26.0–34.0)
MCHC: 32.8 g/dL (ref 30.0–36.0)
MCV: 91.8 fL (ref 80.0–100.0)
Platelets: 219 10*3/uL (ref 150–400)
RBC: 4.15 MIL/uL (ref 3.87–5.11)
RDW: 12.6 % (ref 11.5–15.5)
WBC: 8.2 10*3/uL (ref 4.0–10.5)
nRBC: 0 % (ref 0.0–0.2)

## 2018-05-25 MED ORDER — INSULIN ASPART PROT & ASPART (70-30 MIX) 100 UNIT/ML PEN: 60.0000 [IU] | PEN_INJECTOR | Freq: Two times a day (BID) | SUBCUTANEOUS | 1 refills | Status: DC

## 2018-05-25 MED ORDER — GUAIFENESIN-DM 100-10 MG/5ML PO SYRP: 5.0000 mL | ORAL_SOLUTION | ORAL | Status: DC | PRN

## 2018-05-25 MED ORDER — INSULIN PEN NEEDLE 32G X 8 MM MISC: 1.0000 [IU] | Freq: Four times a day (QID) | 11 refills | Status: AC

## 2018-05-25 MED ORDER — INSULIN REGULAR HUMAN 100 UNIT/ML IJ SOLN: 15.0000 [IU] | Freq: Three times a day (TID) | INTRAMUSCULAR | 3 refills | Status: AC

## 2018-05-25 MED ORDER — BENZONATATE 100 MG PO CAPS
100.0000 mg | ORAL_CAPSULE | Freq: Three times a day (TID) | ORAL | Status: DC | PRN
  Administered 2018-05-25: 100 mg via ORAL
  Filled 2018-05-25: qty 1

## 2018-05-25 NOTE — Care Management Note (Signed)
Case Management Note  Patient Details  Name: Alexandra Elliott MRN: [id number] Date of Birth: 1971-04-23  Subjective/Objective:  DKA, nausea, vomiting                  Action/Plan: NCM spoke to pt and states when she was transported by EMS her insulin was lost. States she has debit card but it was lost and she is waiting on new card. She does not have any cash funds to pay for meds at pharmacy without debit card. Attending MD does not want to dc patient without her being able to get insulin due to high risk for readmission. Pt has [facility] Medicare that covers her meds. Unable to utilize [facility]. Contacted [facility] and states pt has [employer] in pharmacy that she has not picked up. Novolin Kwikpen not covered by insurance. Attending contacted and new RX sent to pharmacy. Pt was given meds to dc home from [facility] (picked up by NCM). She will reimburse $17.90 on next week when she receives her debit card.   Expected Discharge Date:  05/25/18               Expected Discharge Plan:  Home/Self Care  In-House Referral:  NA  Discharge planning Services  CM Consult, Medication Assistance  Post Acute Care Choice:  NA Choice offered to:  NA  DME Arranged:  N/A DME Agency:  NA  HH Arranged:  NA HH Agency:  NA  Status of Service:  Completed, signed off  If discussed at Long Length of Stay Meetings, dates discussed:    Additional Comments:  Elliot Cousin, RN 05/25/2018, 3:46 PM

## 2018-05-25 NOTE — Discharge Instructions (Signed)
Insulin Aspart; Insulin Aspart Protamine injection  What is this medicine?  INSULIN ASPART; INSULIN ASPART PROTAMINE (IN su lin AS part; IN su lin AS part PRO ta meen) is a human-made form of insulin. This drug lowers the amount of sugar in your blood. This medicine is a mixture of a rapid-acting insulin and a longer-acting insulin. It starts working 10 to 20 minutes after injection and continues to work for as long as 12 to 24 hours.  This medicine may be used for other purposes; ask your health care provider or pharmacist if you have questions.  COMMON BRAND NAME(S): NovoLog Mix 70/30  What should I tell my health care provider before I take this medicine?  They need to know if you have any of these conditions:  -episodes of low blood sugar  -eye disease, vision problems  -kidney disease  -liver disease  -an unusual or allergic reaction to insulin, metacresol, other medicines, foods, dyes, or preservatives  -pregnant or trying to get pregnant  -breast-feeding  How should I use this medicine?  This medicine is for injection under the skin. Use exactly as directed. It is important to follow the directions given to you by your health care professional or doctor. You should inject this medicine within 15 minutes of starting your meal. You will be taught how to use this medicine and how to adjust doses for activities and illness. Do not use more insulin than prescribed. Do not use more or less often than prescribed.  Always check the appearance of your insulin before using it. This medicine should be white and cloudy. Do not use if it is not uniformly cloudy after mixing. To mix this medicine, roll the vial gently 10 times in your hands. Make sure to perform the mixing procedures before each injection. Do not mix this medicine with any other insulin or diluent. If you use a pen, be sure to take off the outer needle cover before using the dose. It is important that you put your used needles and syringes in a special  sharps container. Do not put them in a trash can. If you do not have a sharps container, call your pharmacist or healthcare provider to get one.  Talk to your pediatrician regarding the use of this medicine in children. Special care may be needed.  Overdosage: If you think you have taken too much of this medicine contact a poison control center or emergency room at once.  NOTE: This medicine is only for you. Do not share this medicine with others.  What if I miss a dose?  It is important not to miss a dose. Your health care professional or doctor should discuss a plan for missed doses with you. If you do miss a dose, follow their plan. Do not take double doses.  What may interact with this medicine?  -other medicines for diabetes  Many medications may cause changes in blood sugar, these include:  -alcohol containing beverages  -antiviral medicines for HIV or AIDS  -aspirin and aspirin-like drugs  -certain medicines for blood pressure, heart disease, irregular heart beat  -chromium  -diuretics  -female hormones, such as estrogens or progestins, birth control pills  -fenofibrate  -gemfibrozil  -isoniazid  -lanreotide  -female hormones or anabolic steroids  -MAOIs like Carbex, Eldepryl, Marplan, Nardil, and Parnate  -medicines for weight loss  -medicines for allergies, asthma, cold, or cough  -medicines for depression, anxiety, or psychotic disturbances  -niacin  -nicotine  -NSAIDs, medicines for pain and   inflammation, like ibuprofen or naproxen  -octreotide  -pasireotide  -pentamidine  -phenytoin  -probenecid  -quinolone antibiotics such as ciprofloxacin, levofloxacin, ofloxacin  -some herbal dietary supplements  -steroid medicines such as prednisone or cortisone  -sulfamethoxazole; trimethoprim  -thyroid hormones  Some medications can hide the warning symptoms of low blood sugar (hypoglycemia). You may need to monitor your blood sugar more closely if you are taking one of these medications. These  include:  -beta-blockers, often used for high blood pressure or heart problems (examples include atenolol, metoprolol, propranolol)  -clonidine  -guanethidine  -reserpine  This list may not describe all possible interactions. Give your health care provider a list of all the medicines, herbs, non-prescription drugs, or dietary supplements you use. Also tell them if you smoke, drink alcohol, or use illegal drugs. Some items may interact with your medicine.  What should I watch for while using this medicine?  Visit your health care professional or doctor for regular checks on your progress.  A test called the HbA1C (A1C) will be monitored. This is a simple blood test. It measures your blood sugar control over the last 2 to 3 months. You will receive this test every 3 to 6 months.  Learn how to check your blood sugar. Learn the symptoms of low and high blood sugar and how to manage them.  Always carry a quick-source of sugar with you in case you have symptoms of low blood sugar. Examples include hard sugar candy or glucose tablets. Make sure others know that you can choke if you eat or drink when you develop serious symptoms of low blood sugar, such as seizures or unconsciousness. They must get medical help at once.  Tell your doctor or health care professional if you have high blood sugar. You might need to change the dose of your medicine. If you are sick or exercising more than usual, you might need to change the dose of your medicine.  Do not skip meals. Ask your doctor or health care professional if you should avoid alcohol. Many nonprescription cough and cold products contain sugar or alcohol. These can affect blood sugar.  Make sure that you have the right kind of syringe for the type of insulin you use. Try not to change the brand and type of insulin or syringe unless your health care professional or doctor tells you to. Switching insulin brand or type can cause dangerously high or low blood sugar. Always keep  an extra supply of insulin, syringes, and needles on hand. Use a syringe one time only. Throw away syringe and needle in a closed container to prevent accidental needle sticks.  Insulin pens and cartridges should never be shared. Even if the needle is changed, sharing may result in passing of viruses like hepatitis or HIV.  Each time you get a new box of pen needles, check to see if they are the same type as the ones you were trained to use. If not, ask your health care professional to show you how to use this new type properly.  Wear a medical ID bracelet or chain, and carry a card that describes your disease and details of your medicine and dosage times.  What side effects may I notice from receiving this medicine?  Side effects that you should report to your doctor or health care professional as soon as possible:  -allergic reactions like skin rash, itching or hives, swelling of the face, lips, or tongue  -breathing problems  -signs and symptoms of high   blood sugar such as dizziness, dry mouth, dry skin, fruity breath, nausea, stomach pain, increased hunger or thirst, increased urination  -signs and symptoms of low blood sugar such as feeling anxious, confusion, dizziness, increased hunger, unusually weak or tired, sweating, shakiness, cold, irritable, headache, blurred vision, fast heartbeat, loss of consciousness  Side effects that usually do not require medical attention (report to your doctor or health care professional if they continue or are bothersome):  -increase or decrease in fatty tissue under the skin due to overuse of a particular injection site  -itching, burning, swelling, or rash at site where injected  This list may not describe all possible side effects. Call your doctor for medical advice about side effects. You may report side effects to FDA at 1-800-FDA-1088.  Where should I keep my medicine?  Keep out of the reach of children.  Store unopened insulin vials in a refrigerator between 2 and 8  degrees C (36 and 46 degrees F). Do not freeze or use if the insulin has been frozen. Opened vials (vials currently in use) may be stored in the refrigerator or at room temperature, at approximately 30 degrees C (86 degrees F) or cooler. Keeping your insulin at room temperature decreases the amount of pain during injection. Once opened, your insulin can be used for 28 days. After 28 days, the vial of insulin should be thrown away.  Store unopened FlexPen in a refrigerator between 2 and 8 degrees C (36 and 46 degrees F.) Do not freeze or use if the insulin has been frozen. Once opened, the FlexPen should be kept at room temperature, at approximately 30 degrees C (86 degrees F) or cooler. Do not store in the refrigerator. Once opened, the insulin can be used for 14 days. After 14 days, the FlexPen should be thrown away.  Protect from light and excessive heat. Throw away any unused medicine after the expiration date or after the specified time for room temperature storage has passed.  NOTE: This sheet is a summary. It may not cover all possible information. If you have questions about this medicine, talk to your doctor, pharmacist, or health care provider.  © 2019 [employer] (2017-10-19 13:17:14)

## 2018-05-25 NOTE — Progress Notes (Signed)
     Dr Hochrein's consult note reviewed. Isolated episode of 9 beat run of wide complex tachycardia in setting of K of 3.1 which has been replated. No recurrence by tele review.  Echo pending, preliminary review shows normal LVEF without significant WMAs. Cath in 2016 with normal coronaries, normal stress echo 2017. F/u final echo report, at this time no additional cardiology recs if normal echo.       For questions or updates, please contact [facility] Please consult [email or web address] for contact info under        Signed, Dina Rich, MD  05/25/2018, 7:42 AM

## 2018-05-25 NOTE — Discharge Summary (Signed)
Physician Discharge Summary  Alexandra Elliott ZOX:[id number] DOB: [birth year] DOA: 05/23/2018  PCP: Wilburn Mylar, MD  Admit date: 05/23/2018 Discharge date: 05/25/2018  Admitted From: Home Disposition:  Home  Recommendations for Outpatient Follow-up:  1. Follow up with PCP in 1-2 weeks 2. Please obtain BMP/CBC in one week 3. Please take the insulin prescribed until your insurance card comes through and you can switch back to your regular insulin 4. Please follow-up with general surgery about your mesh  Home Health: No Equipment/Devices: No  Discharge Condition: Stable CODE STATUS:*Full  Diet recommendation: Heart Healthy / Carb Modified   Brief/Interim Summary:  #) Type 2 diabetes/DKA: Patient was admitted with nausea, vomiting, mild DKA.  She was noted to have a slight gap.  She had missed her insulin as she had lost it and was not able to afford new insulin as she is still waiting for insurance card.  She was maintained on IV insulin and her home insulin regimen was restarted and her blood sugars well controlled.  Unfortunately because she is still waiting for her insurance card she cannot afford glargine and short acting insulin and so she was discharged on 70/30 to cover her until she can have her insurance card to afford her regular insulin.  #) Shortness of breath/VT: Patient had a brief run of asymptomatic ventricular tachycardia.  She did have a stress test that was mildly abnormal and a coronary catheterization in 2016 that was unremarkable.  Troponins were negative.  Echo on preliminary showed no wall motion normalities with a normal EF.  Cardiology was consulted and recommended no additional work-up.  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  She had a prolonged and complicated postoperative wound care.  She noted an open sore for  years that she even try to keep clean.  Patient is reported some pain in that side for some years.  Patient was consulted by general surgery who recommended CT scan which was unremarkable and will follow-up as an outpatient.  #) Hypertension/hyperlipidemia: Patient was continued on fenofibrate, aspirin, loading, losartan, lovastatin.  #) GERD: Patient was continued on PPI.  #) Lower extremity edema: Patient's home furosemide was held in the setting of DKA and dehydration however this may be restarted on discharge.  #) Iron deficiency anemia: Patient was continued on home iron supplementation.  #) Pain/psych: Patient was continued on PRN hydroxyzine.  Discharge Diagnoses:  Principal Problem:   Nausea and vomiting Active Problems:   Diabetes mellitus without complication (HCC)   Hypertension   Hyperglycemia   HLD (hyperlipidemia)   DKA (diabetic ketoacidoses) [facility])    Discharge Instructions  Discharge Instructions    Call MD for:  difficulty breathing, headache or visual disturbances   Complete by:  As directed    Call MD for:  hives   Complete by:  As directed    Call MD for:  persistant dizziness or light-headedness   Complete by:  As directed    Call MD for:  persistant nausea and vomiting   Complete by:  As directed    Call MD for:  redness, tenderness, or signs of infection (pain, swelling, redness, odor or green/yellow discharge around incision site)   Complete by:  As directed    Call MD for:  severe uncontrolled pain   Complete by:  As directed  Call MD for:  temperature >100.4   Complete by:  As directed    Diet - low sodium heart healthy   Complete by:  As directed    Discharge instructions   Complete by:  As directed    Please hold your home insulin and take 70/30 until you go forward your home insulin.  Please follow-up with the surgeons as an outpatient.  Please check your blood sugar 4 times a day.   Increase activity slowly   Complete by:  As directed       Allergies as of 05/25/2018      Reactions   Levofloxacin Other (See Comments)   Loss of hearing   Lisinopril Swelling   Had lip swelling but determined not to be related to medication      Medication List    TAKE these medications   albuterol 108 (90 Base) MCG/ACT inhaler Commonly known as:  PROVENTIL HFA;VENTOLIN HFA Inhale 2 puffs into the lungs every 4 (four) hours as needed for wheezing or shortness of breath.   amLODipine 10 MG tablet Commonly known as:  NORVASC Take 10 mg by mouth daily.   aspirin 81 MG EC tablet Take 1 tablet (81 mg total) by mouth daily.   fenofibrate 160 MG tablet Take 160 mg by mouth daily.   ferrous sulfate 325 (65 FE) MG EC tablet Take 325 mg by mouth daily.   furosemide 20 MG tablet Commonly known as:  LASIX Take 1 tablet (20 mg total) by mouth daily.   hydrOXYzine 25 MG tablet Commonly known as:  ATARAX/VISTARIL Take 25 mg by mouth 3 (three) times daily as needed for anxiety.   insulin aspart protamine - aspart (70-30) 100 UNIT/ML FlexPen Commonly known as:  NOVOLOG MIX 70/30 FLEXPEN Inject 0.6 mLs (60 Units total) into the skin 2 (two) times daily with a meal.   Insulin Glargine (2 Unit Dial) 300 UNIT/ML Sopn Commonly known as:  TOUJEO MAX SOLOSTAR Inject 40 Units into the skin daily. What changed:  when to take this   insulin lispro 100 UNIT/ML KwikPen Commonly known as:  HUMALOG KWIKPEN Inject 0.25 mLs (25 Units total) into the skin See admin instructions. Inject 25 units with each meal, and 10 units with a snack at night. What changed:    how much to take  additional instructions   losartan 25 MG tablet Commonly known as:  COZAAR Take 1 tablet (25 mg total) by mouth daily.   lovastatin 40 MG tablet Commonly known as:  MEVACOR Take 40 mg by mouth at bedtime.   pantoprazole 40 MG tablet Commonly known as:  PROTONIX Take 40 mg by mouth at bedtime.       Allergies  Allergen Reactions  . Levofloxacin Other (See  Comments)    Loss of hearing  . Lisinopril Swelling    Had lip swelling but determined not to be related to medication    Consultations:  Cardiology  General surgery   Procedures/Studies: Dg Chest 2 View  Result Date: 05/23/2018 CLINICAL DATA:  Shortness of breath EXAM: CHEST - 2 VIEW COMPARISON:  05/17/2018 FINDINGS: Cardiac shadows within normal limits. The lungs are well aerated bilaterally. Persistent left basilar infiltrate is noted. No new focal infiltrate or effusion is seen. Degenerative changes of the thoracic spine are noted. IMPRESSION: Stable left basilar infiltrate. Electronically Signed   By: Alcide Clever M.D.   On: 05/23/2018 12:37   Dg Chest 2 View  Result Date: 05/17/2018 CLINICAL DATA:  Severe  shortness of breath today, pneumonia, diabetes mellitus, hypertension, GERD EXAM: CHEST - 2 VIEW COMPARISON:  05/10/2018 FINDINGS: Upper normal size of cardiac silhouette. Tortuous aorta. Mediastinal contours and pulmonary vascularity otherwise normal. Minimal improvement in LEFT lung infiltrates though significant infiltrate persists particularly posteriorly on lateral view. RIGHT lung clear. No pleural effusion or pneumothorax. IMPRESSION: Persistent LEFT lung infiltrates consistent with pneumonia. Continued follow-up until resolution recommended to exclude underlying abnormalities including tumor. Electronically Signed   By: Ulyses SouthwardMark  Boles M.D.   On: 05/17/2018 13:00   Dg Chest 2 View  Result Date: 05/10/2018 CLINICAL DATA:  Cough EXAM: CHEST - 2 VIEW COMPARISON:  04/23/2018 FINDINGS: Partial consolidation within the left lower lobe posteriorly. No pleural effusion. Normal heart size. No pneumothorax. IMPRESSION: Partial consolidation in the posterior left lower lobe concerning for a pneumonia. Electronically Signed   By: Jasmine PangKim  Fujinaga M.D.   On: 05/10/2018 22:39   Ct Angio Chest Pe W And/or Wo Contrast  Result Date: 05/10/2018 CLINICAL DATA:  48 y/o F; shortness of breath with  bilateral lower extremity swelling. EXAM: CT ANGIOGRAPHY CHEST WITH CONTRAST TECHNIQUE: Multidetector CT imaging of the chest was performed using the standard protocol during bolus administration of intravenous contrast. Multiplanar CT image reconstructions and MIPs were obtained to evaluate the vascular anatomy. CONTRAST:  70 cc Isovue 370 COMPARISON:  05/10/2018 chest radiograph FINDINGS: Cardiovascular: Satisfactory opacification of the pulmonary arteries to the segmental level. No evidence of pulmonary embolism. Normal heart size. No pericardial effusion. Mediastinum/Nodes: No enlarged mediastinal, hilar, or axillary lymph nodes. Thyroid gland, trachea, and esophagus demonstrate no significant findings. Lungs/Pleura: Left lower lobe consolidation. No pleural effusion or pneumothorax. Upper Abdomen: No acute abnormality. Musculoskeletal: No chest wall abnormality. No acute or significant osseous findings. Review of the MIP images confirms the above findings. IMPRESSION: 1. No pulmonary embolus identified. 2. Left lower lobe pneumonia. Electronically Signed   By: Mitzi HansenLance  Furusawa-Stratton M.D.   On: 05/10/2018 23:58   Ct Abdomen Pelvis W Contrast  Result Date: 05/24/2018 CLINICAL DATA:  Umbilical and epigastric pain yesterday. EXAM: CT ABDOMEN AND PELVIS WITH CONTRAST TECHNIQUE: Multidetector CT imaging of the abdomen and pelvis was performed using the standard protocol following bolus administration of intravenous contrast. CONTRAST:  100mL OMNIPAQUE IOHEXOL 300 MG/ML  SOLN COMPARISON:  November 04, 2017 FINDINGS: Lower chest: Consolidation of medial left lower lobe is identified. The heart size is enlarged. Hepatobiliary: Diffuse low density of the liver is identified without focal liver lesion. Small gallstones identified in the gallbladder. No inflammation is noted around gallbladder. The biliary ducts are normal. Pancreas: Unremarkable. No pancreatic ductal dilatation or surrounding inflammatory changes.  Spleen: Normal in size without focal abnormality. Adrenals/Urinary Tract: The bilateral adrenal glands are normal. There is no nephrolithiasis or hydronephrosis bilaterally. Low-density lesion is identified in the posterior midpole left kidney unchanged. The bladder is normal. Stomach/Bowel: Stomach is within normal limits. Appendix appears normal. No evidence of bowel wall thickening, distention, or inflammatory changes. Vascular/Lymphatic: No significant vascular findings are present. No enlarged abdominal or pelvic lymph nodes. Reproductive: Status post hysterectomy. No adnexal masses. 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 Musculoskeletal: Degenerative joint changes of the spine are noted. IMPRESSION: Medial left lower lobe pneumonia. No acute abnormality identified in the abdomen pelvis. Fatty infiltration of liver. Cholelithiasis. Electronically Signed   By: Sherian ReinWei-Chen  Lin M.D.   On: 05/24/2018 13:44   Vas [place] Lower Extremity Venous (dvt)  Result Date: 05/11/2018  Lower Venous Study Indications: Swelling, SOB, and  Morbid obesity.  Risk Factors: Pneumonia. Limitations: Body habitus. Comparison Study: No prior study on file Performing Technologist: Sherren Kerns RVS  Examination Guidelines: A complete evaluation includes B-mode imaging, spectral Doppler, color Doppler, and power Doppler as needed of all accessible portions of each vessel. Bilateral testing is considered an integral part of a complete examination. Limited examinations for reoccurring indications may be performed as noted.  Right Venous Findings: +---------+---------------+---------+-----------+----------+----------------+          CompressibilityPhasicitySpontaneityPropertiesSummary          +---------+---------------+---------+-----------+----------+----------------+ CFV      Full           Yes      Yes                                    +---------+---------------+---------+-----------+----------+----------------+ SFJ      Full                                                          +---------+---------------+---------+-----------+----------+----------------+ FV Prox  Full                                         visualized       +---------+---------------+---------+-----------+----------+----------------+ FV Mid                                                visualized       +---------+---------------+---------+-----------+----------+----------------+ FV Distal                                             visualized       +---------+---------------+---------+-----------+----------+----------------+ PFV                                                   Not visualized   +---------+---------------+---------+-----------+----------+----------------+ POP      Full           Yes      Yes                                   +---------+---------------+---------+-----------+----------+----------------+ PTV      Full                                                          +---------+---------------+---------+-----------+----------+----------------+ PERO     Full  Not all segments +---------+---------------+---------+-----------+----------+----------------+  Left Venous Findings: +---------+---------------+---------+-----------+----------+--------------+          CompressibilityPhasicitySpontaneityPropertiesSummary        +---------+---------------+---------+-----------+----------+--------------+ CFV      Full           Yes      Yes                                 +---------+---------------+---------+-----------+----------+--------------+ SFJ      Full                                                        +---------+---------------+---------+-----------+----------+--------------+ FV Prox  Full                                                         +---------+---------------+---------+-----------+----------+--------------+ FV Mid   Full                                                        +---------+---------------+---------+-----------+----------+--------------+ FV DistalFull                                                        +---------+---------------+---------+-----------+----------+--------------+ PFV                                                   Not visualized +---------+---------------+---------+-----------+----------+--------------+ POP      Full           Yes      Yes                                 +---------+---------------+---------+-----------+----------+--------------+ PTV      Full                                                        +---------+---------------+---------+-----------+----------+--------------+ PERO     Full                                                        +---------+---------------+---------+-----------+----------+--------------+    Summary: Right: There is no evidence of deep vein thrombosis in the lower extremity. However, portions of this examination were limited- see technologist comments above. Left: There is no evidence of deep vein thrombosis  in the lower extremity.  *See table(s) above for measurements and observations. Electronically signed by Sherald Hess MD on 05/11/2018 at 2:11:23 PM.    Final     Echo 05/24/2018: Shows normal EF with no wall motion abnormalities   Subjective:   Discharge Exam: Vitals:   05/25/18 0330 05/25/18 0700  BP:  120/62  Pulse:  87  Resp:  18  Temp: 98.4 F (36.9 C) 99.3 F (37.4 C)  SpO2:  96%   Vitals:   05/25/18 0026 05/25/18 0041 05/25/18 0330 05/25/18 0700  BP: 121/73   120/62  Pulse: (!) 112 97  87  Resp: 20   18  Temp: 98.4 F (36.9 C)  98.4 F (36.9 C) 99.3 F (37.4 C)  TempSrc: Oral  Oral Oral  SpO2: 94%   96%  Weight:      Height:        General exam: Appears calm and comfortable   Respiratory system: Clear to auscultation. Respiratory effort normal. Cardiovascular system: Regular rate and rhythm, no murmurs Gastrointestinal system: Soft, nondistended, no rebound or guarding, plus bowel sounds Central nervous system: Alert and oriented.  Grossly intact, moving all extremities Extremities: 1+ lower extremity edema. Skin: Underneath midline of abdomen noted to have 2 cm wound with extrusion of what appears to be abdominal mesh Psychiatry: Judgement and insight appear normal. Mood & affect appropriate.     The results of significant diagnostics from this hospitalization (including imaging, microbiology, ancillary and laboratory) are listed below for reference.     Microbiology: Recent Results (from the past 240 hour(s))  MRSA PCR Screening     Status: None   Collection Time: 05/23/18  6:45 PM  Result Value Ref Range Status   MRSA by PCR NEGATIVE NEGATIVE Final    Comment:        The GeneXpert MRSA Assay (FDA approved for NASAL specimens only), is one component of a comprehensive MRSA colonization surveillance program. It is not intended to diagnose MRSA infection nor to guide or monitor treatment for MRSA infections. Performed at [facility], [address], [place], [place] [address]      Labs: BNP (last 3 results) Recent Labs    04/23/18 1404 05/10/18 2141  BNP 40.7 15.1   Basic Metabolic Panel: Recent Labs  Lab 05/23/18 1750 05/23/18 2304 05/24/18 0522 05/24/18 1730 05/25/18 0429  NA 140 137 135 134* 133*  K 3.8 3.2* 3.1* 3.9 4.0  CL 111 111 109 105 104  CO2 19* 20* 19* 21* 20*  GLUCOSE 155* 142* 195* 152* 293*  BUN 10 8 8 7 7   CREATININE 0.70 0.59 0.59 0.67 0.68  CALCIUM 9.0 8.7* 8.6* 8.8* 8.7*  MG 1.9  --   --   --   --   PHOS 1.8*  --   --   --   --    Liver Function Tests: Recent Labs  Lab 05/23/18 1031  AST 15  ALT 24  ALKPHOS 83  BILITOT 1.7*  PROT 8.5*  ALBUMIN 4.2   Recent Labs  Lab  05/23/18 1031  LIPASE 31   No results for input(s): AMMONIA in the last 168 hours. CBC: Recent Labs  Lab 05/23/18 1031 05/25/18 0429  WBC 7.7 8.2  NEUTROABS 4.2  --   HGB 15.1* 12.5  HCT 46.0 38.1  MCV 92.6 91.8  PLT 299 219   Cardiac Enzymes: Recent Labs  Lab 05/24/18 0756  TROPONINI <0.03   BNP: Invalid input(s): POCBNP  CBG: Recent Labs  Lab 05/24/18 1706 05/24/18 2128 05/24/18 2330 05/25/18 0337 05/25/18 0825  GLUCAP 129* 70 238* 256* 302*   D-Dimer No results for input(s): DDIMER in the last 72 hours. Hgb A1c No results for input(s): HGBA1C in the last 72 hours. Lipid Profile No results for input(s): CHOL, HDL, LDLCALC, TRIG, CHOLHDL, LDLDIRECT in the last 72 hours. Thyroid function studies No results for input(s): TSH, T4TOTAL, T3FREE, THYROIDAB in the last 72 hours.  Invalid input(s): FREET3 Anemia work up No results for input(s): VITAMINB12, FOLATE, FERRITIN, TIBC, IRON, RETICCTPCT in the last 72 hours. Urinalysis    Component Value Date/Time   COLORURINE STRAW (A) 05/23/2018 1031   APPEARANCEUR CLEAR 05/23/2018 1031   LABSPEC 1.032 (H) 05/23/2018 1031   PHURINE 5.0 05/23/2018 1031   GLUCOSEU >=500 (A) 05/23/2018 1031   HGBUR NEGATIVE 05/23/2018 1031   BILIRUBINUR NEGATIVE 05/23/2018 1031   KETONESUR 80 (A) 05/23/2018 1031   PROTEINUR NEGATIVE 05/23/2018 1031   NITRITE NEGATIVE 05/23/2018 1031   LEUKOCYTESUR NEGATIVE 05/23/2018 1031   Sepsis Labs Invalid input(s): PROCALCITONIN,  WBC,  LACTICIDVEN Microbiology Recent Results (from the past 240 hour(s))  MRSA PCR Screening     Status: None   Collection Time: 05/23/18  6:45 PM  Result Value Ref Range Status   MRSA by PCR NEGATIVE NEGATIVE Final    Comment:        The GeneXpert MRSA Assay (FDA approved for NASAL specimens only), is one component of a comprehensive MRSA colonization surveillance program. It is not intended to diagnose MRSA infection nor to guide or monitor treatment  for MRSA infections. Performed at [facility], [address] [place], [place] [address]      Time coordinating discharge: 43  SIGNED:   Delaine Lame, MD  [facility] 05/25/2018, 10:43 AM  If 7PM-7AM, please contact night-coverage [email or web address] Password [email or web address]

## 2018-05-29 NOTE — Congregational Nurse Program (Signed)
  Dept: [phone]   Congregational Nurse Program Note  Date of Encounter: 05/29/2018  Past Medical History: Past Medical History:  Diagnosis Date  . Diabetes mellitus without complication (HCC)   . GERD (gastroesophageal reflux disease)   . High cholesterol   . HLD (hyperlipidemia) 05/23/2018  . Hypertension     Encounter Details: CNP Questionnaire - 05/29/18 1827      Questionnaire   Patient Status  Not Applicable    Race  Black or African American    Location Patient Served At  [employer]    Uninsured  Not Applicable    Food  No food insecurities    Housing/Utilities  No permanent housing    Transportation  Yes, need transportation assistance    Interpersonal Safety  Yes, feel physically and emotionally safe where you currently live    Medication  Yes, have medication insecurities    Medical Provider  Yes    Referrals  [facility] Agency    ED Visit Averted  Not Applicable    Life-Saving Intervention Made  Not Applicable      Follow up. Client has her insulin now and blood sugars are normal. She feels much better. Wanting to get things moving concerning her housing and other personal concerns. She has avocate and [facility] Adela Ports is working with her to meet these needs [employer] [facility] [phone].Marland Kitchen  Marland Kitchen

## 2019-12-14 IMAGING — CR DG CHEST 2V
2 series · 2 of 2 positions shown · non-contrast
Comparison: 11/04/2017

CLINICAL DATA: Shortness of breath for 2 weeks

EXAM:
CHEST - 2 VIEW

[chest pa]
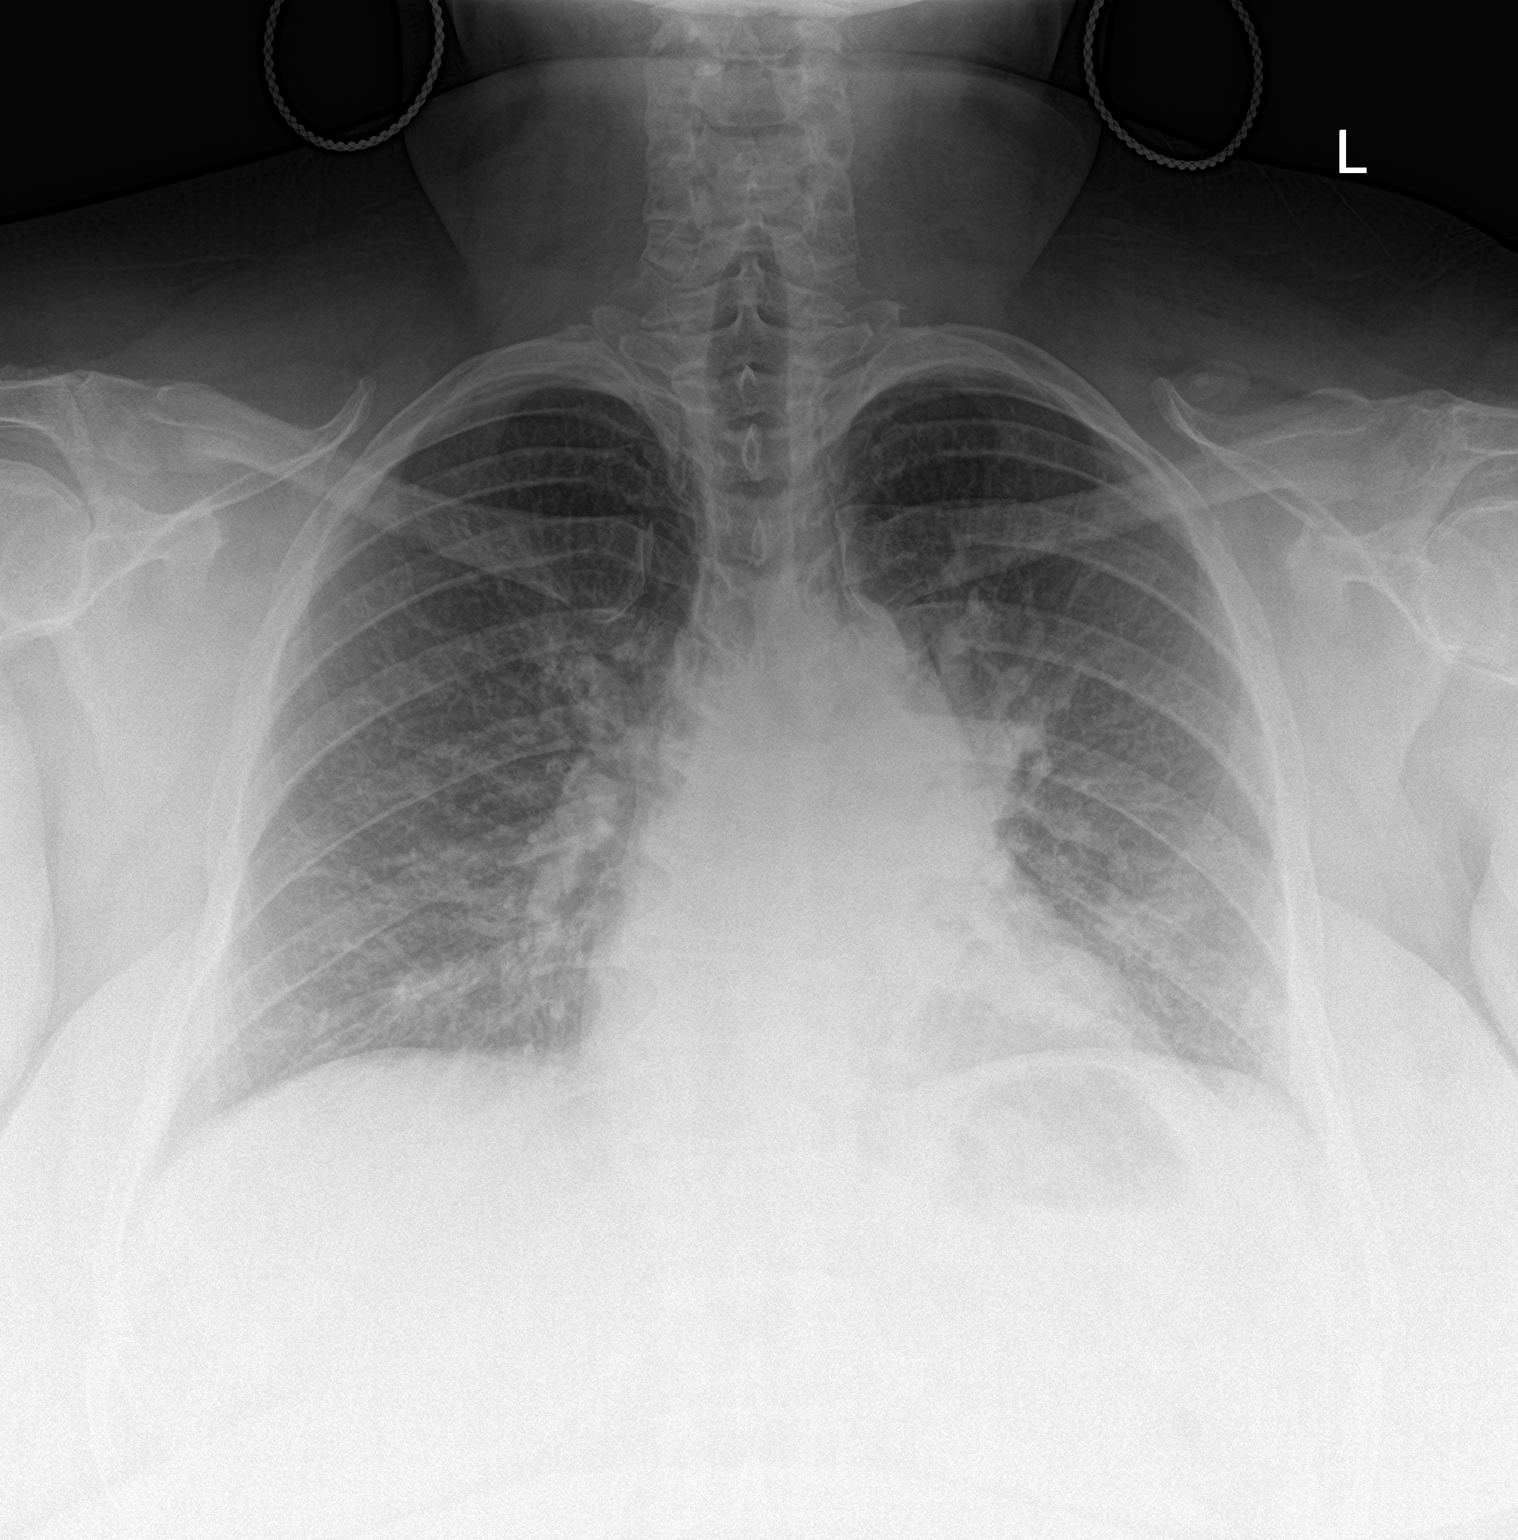

[chest lat]
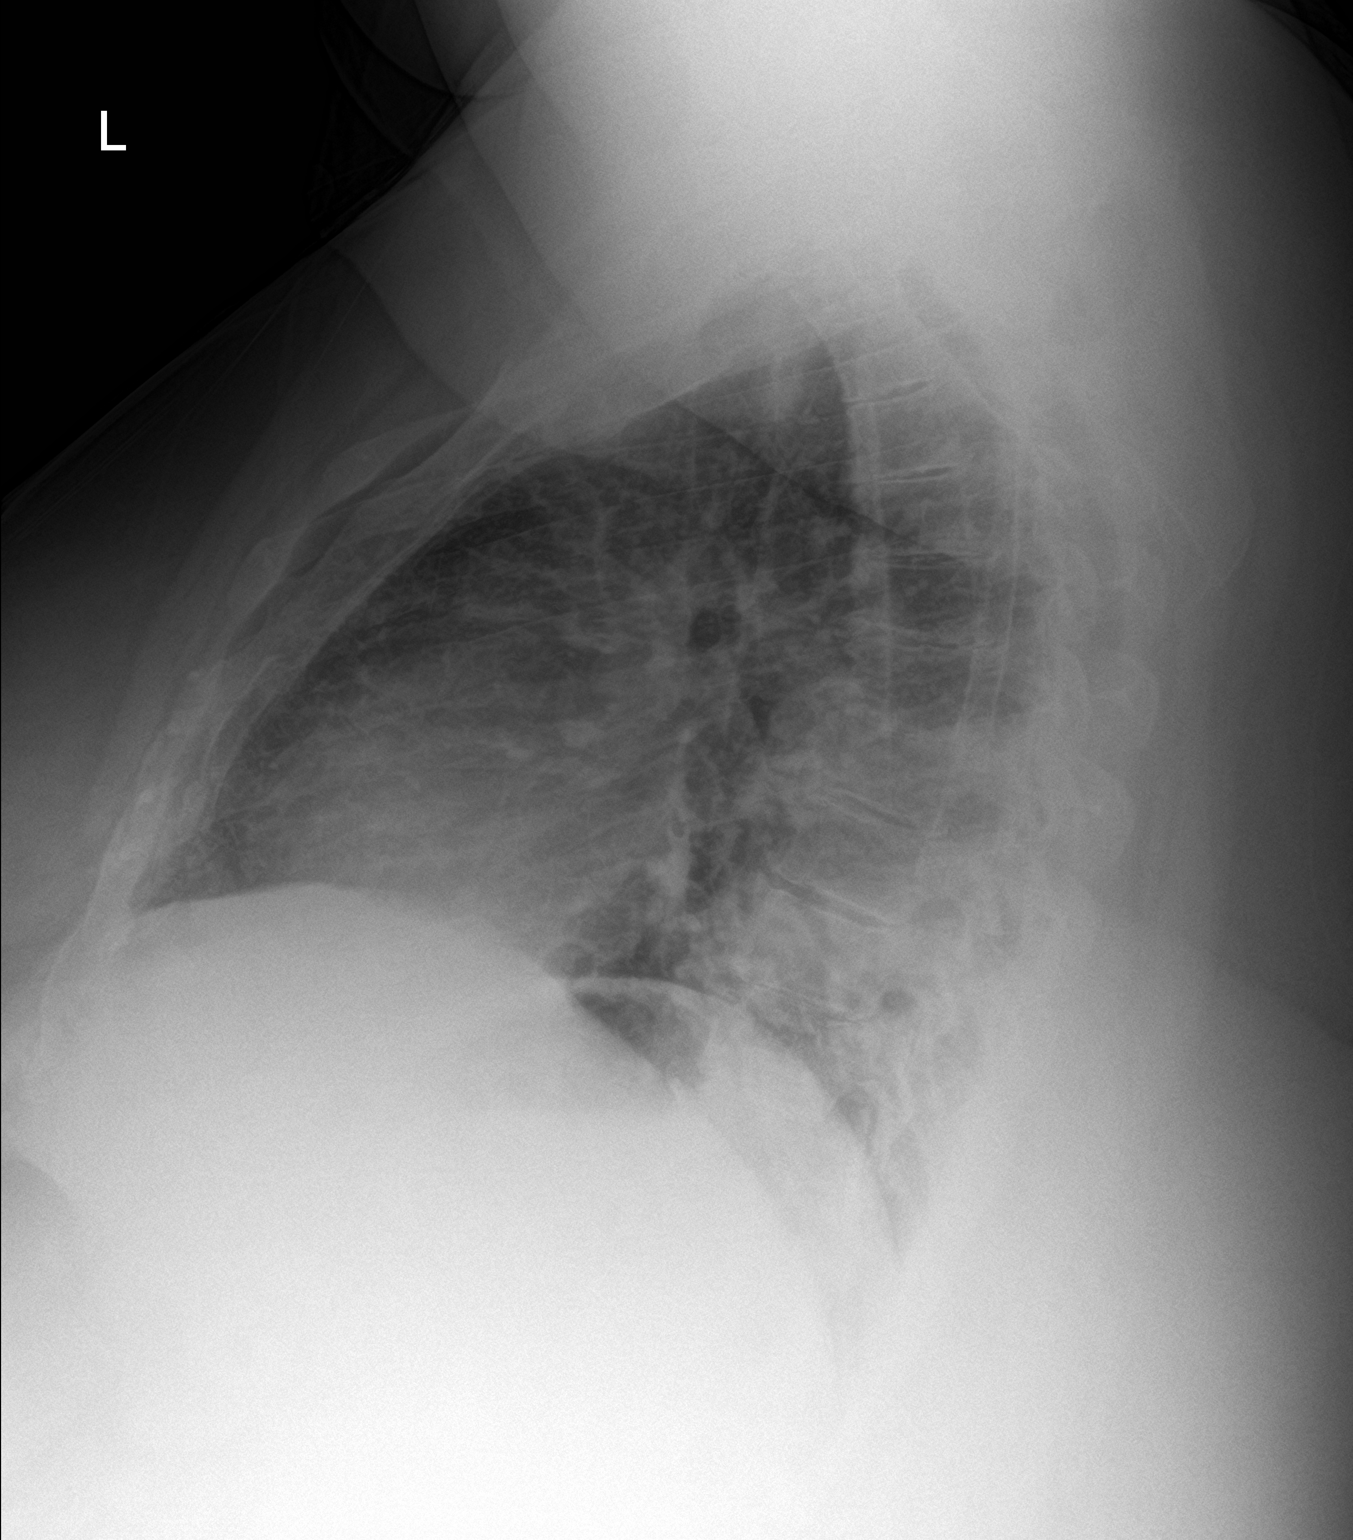

[2 of 2 positions shown; findings below may reference images not displayed]

FINDINGS: Diffuse bilateral mild interstitial thickening. No pleural effusion
or pneumothorax. Stable cardiomediastinal silhouette. No acute
osseous abnormality.
IMPRESSION: Diffuse bilateral interstitial thickening which may reflect mild
interstitial edema versus interstitial infection.

## 2020-01-13 IMAGING — CR DG CHEST 2V
2 series · 2 of 2 positions shown · non-contrast
Comparison: 05/17/2018

CLINICAL DATA: Shortness of breath

EXAM:
CHEST - 2 VIEW

[w chest pa]
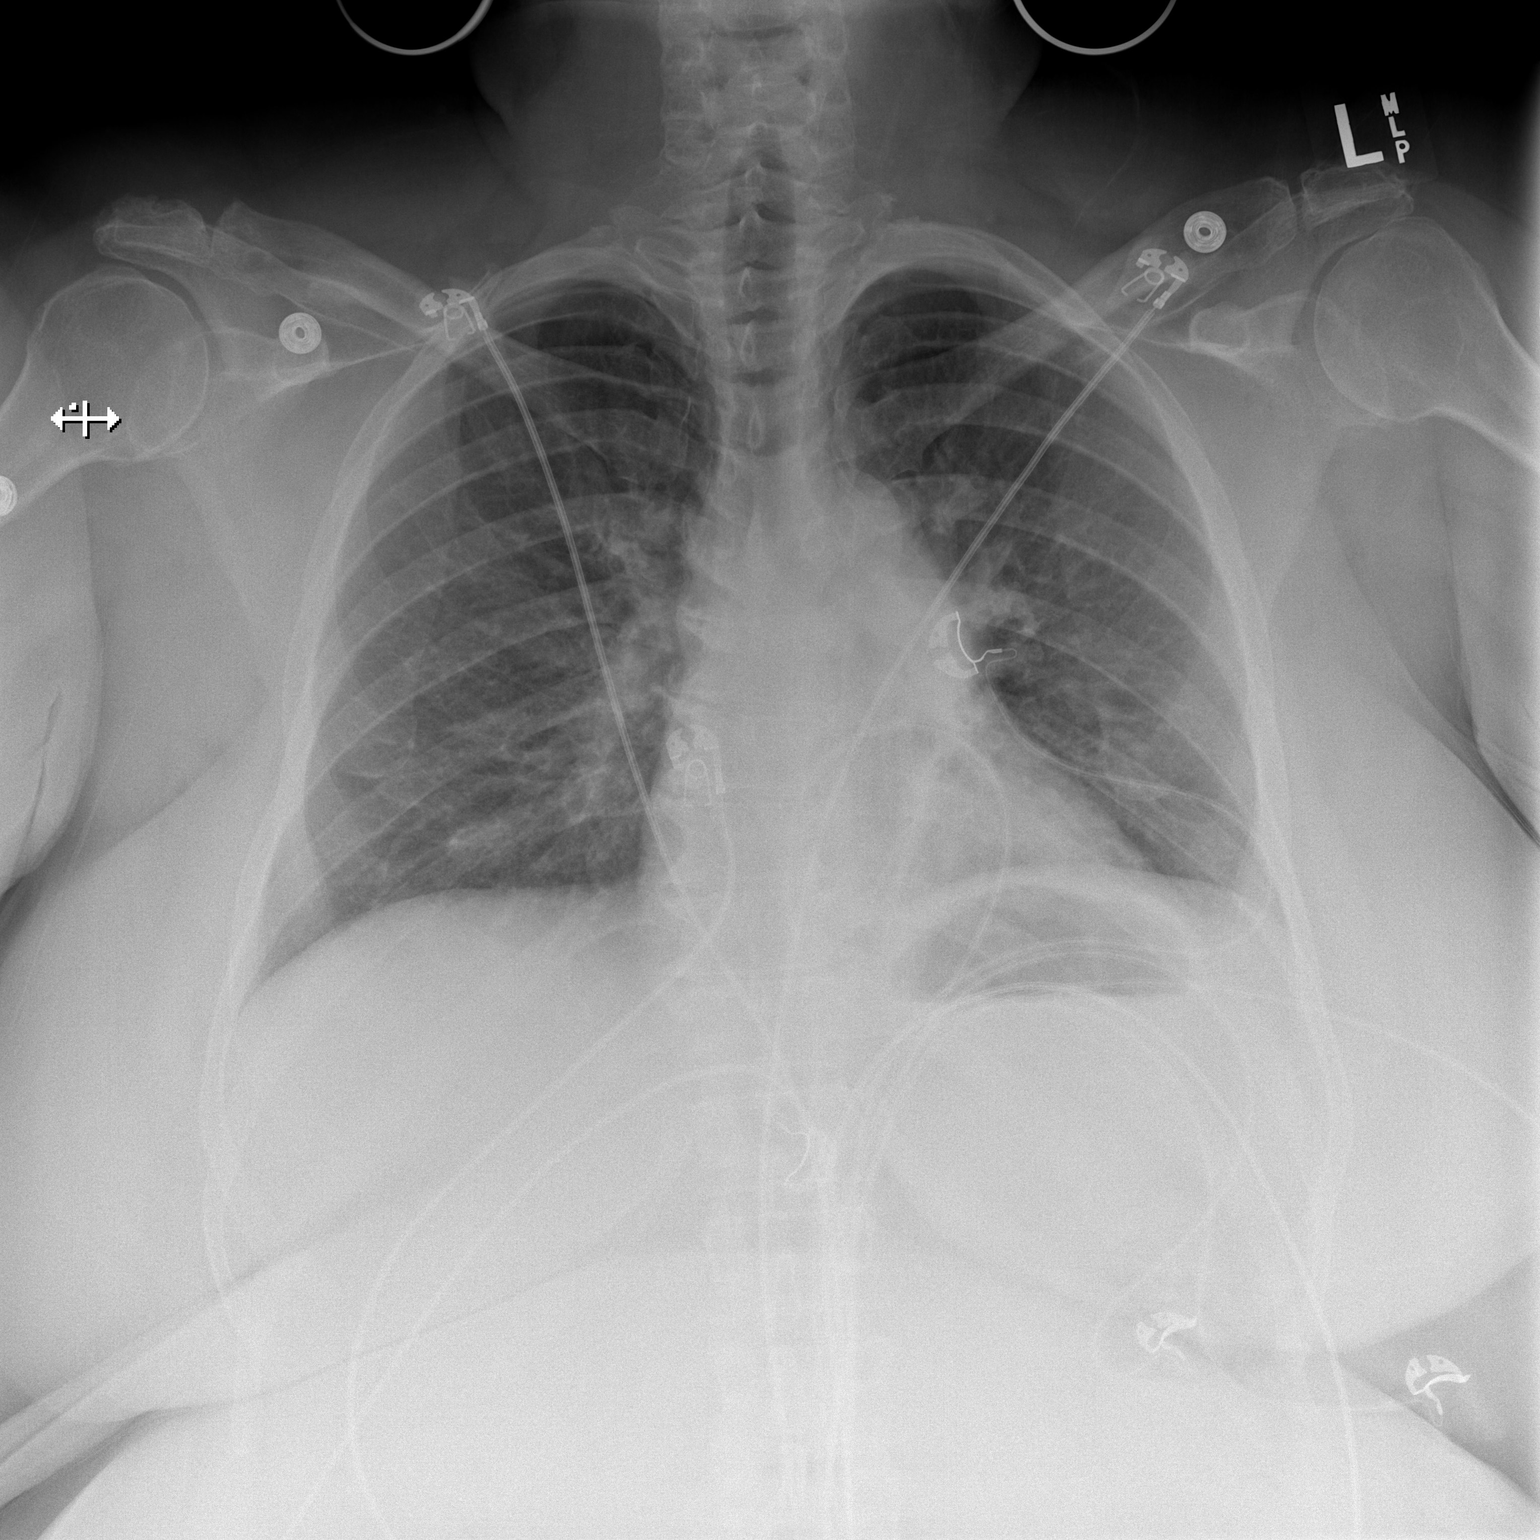

[w chest lat]
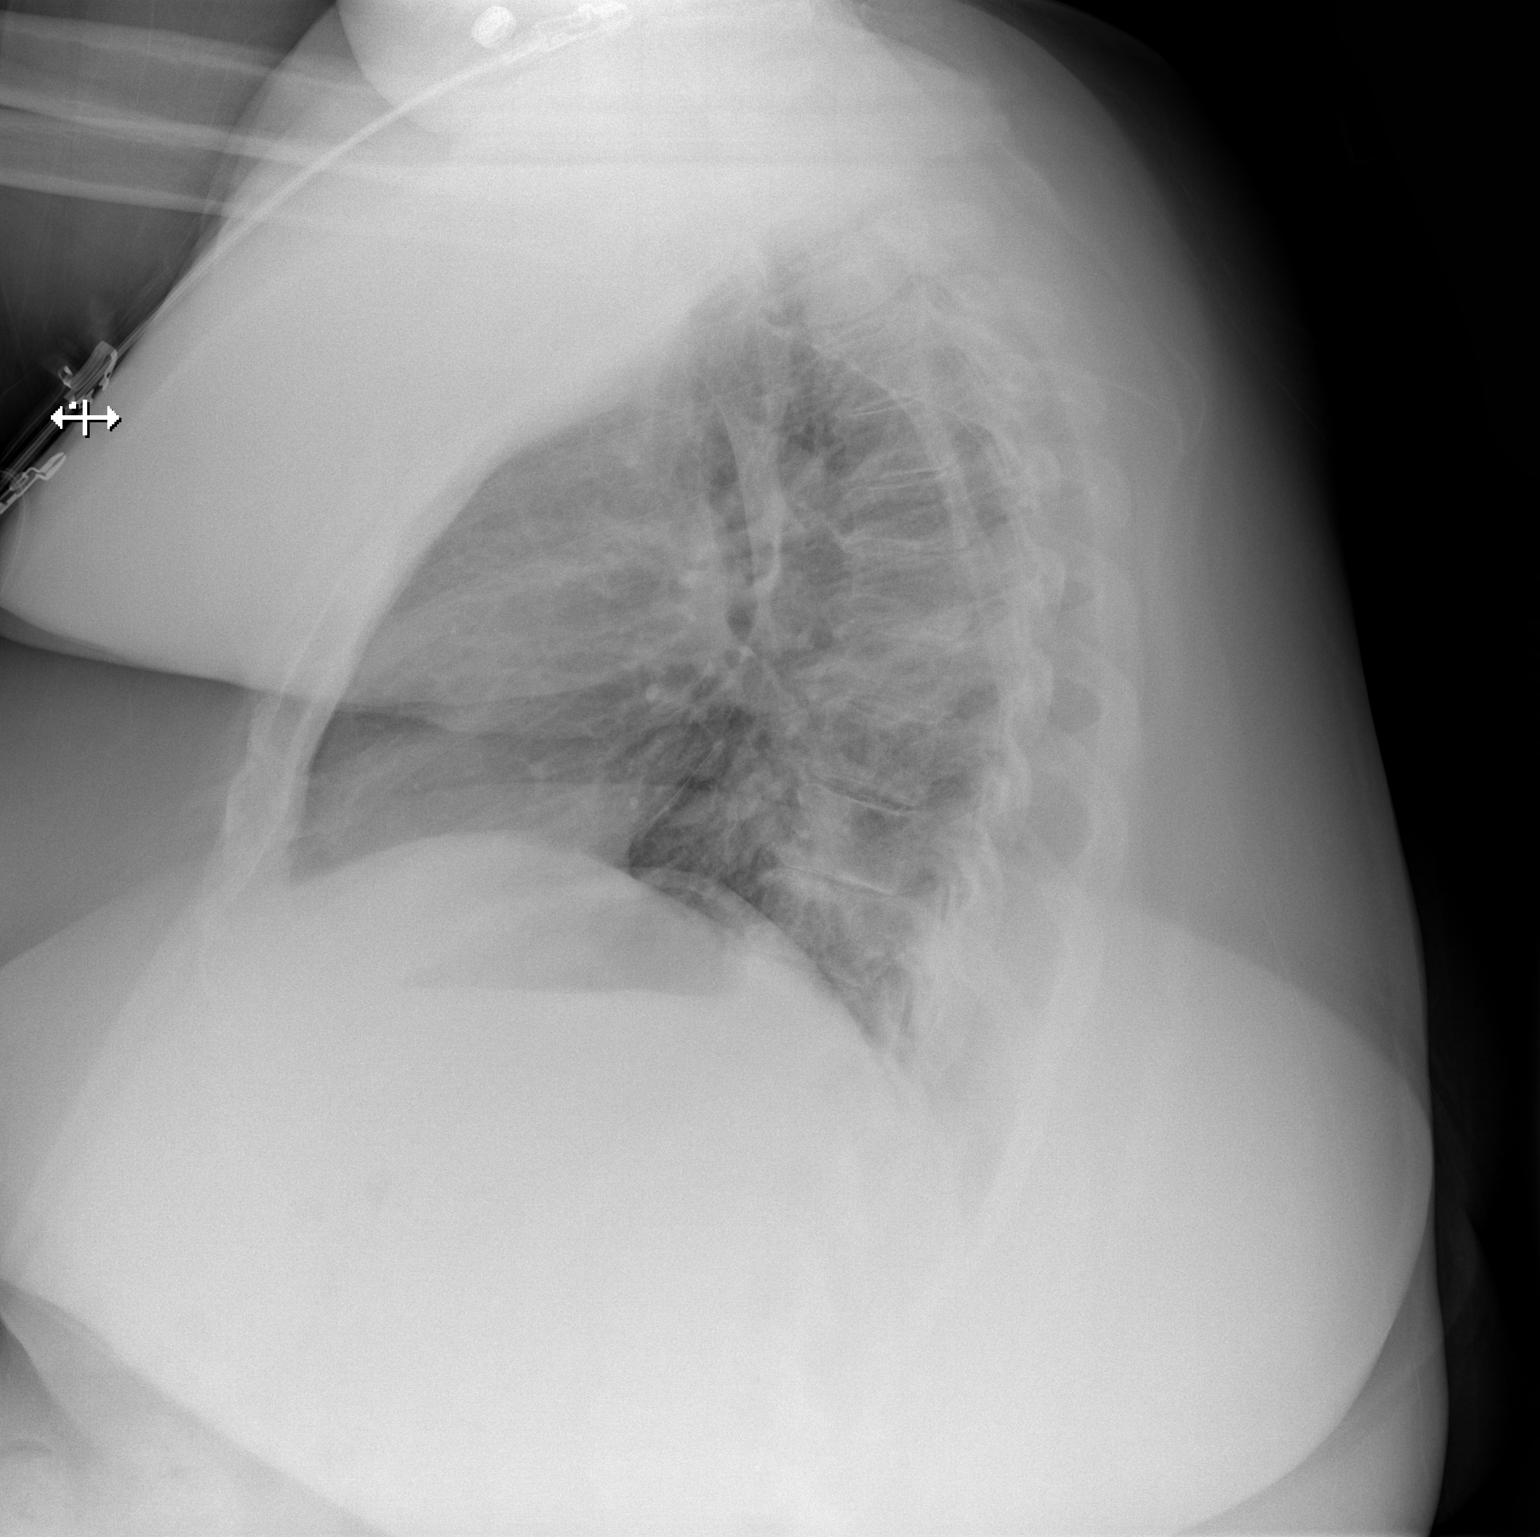

[2 of 2 positions shown; findings below may reference images not displayed]

FINDINGS: Cardiac shadows within normal limits. The lungs are well aerated
bilaterally. Persistent left basilar infiltrate is noted. No new
focal infiltrate or effusion is seen. Degenerative changes of the
thoracic spine are noted.
IMPRESSION: Stable left basilar infiltrate.

## 2020-01-14 IMAGING — CT CT ABD-PELV W/ CM
2 of 5 series · 16 of 46 positions shown, 18 images · IV contrast (omnipaque)
Comparison: November 04, 2017

CLINICAL DATA: Umbilical and epigastric pain yesterday.

EXAM:
CT ABDOMEN AND PELVIS WITH CONTRAST
TECHNIQUE: Multidetector CT imaging of the abdomen and pelvis was performed
using the standard protocol following bolus administration of
intravenous contrast.
CONTRAST:  100mL OMNIPAQUE IOHEXOL 300 MG/ML  SOLN

[Series 2: axial st · axial · 0.77mm/px · z∈[-436,-16]mm · 13 of 98 slices shown, 15 images]
[im 7/98  soft-tissue]
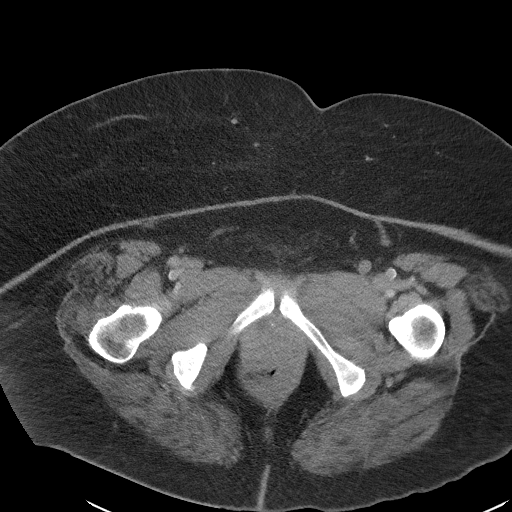
[im 7/98  bone]
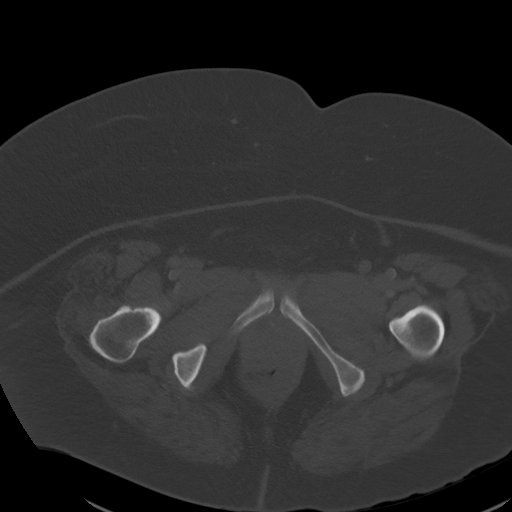
[im 13/98  soft-tissue]
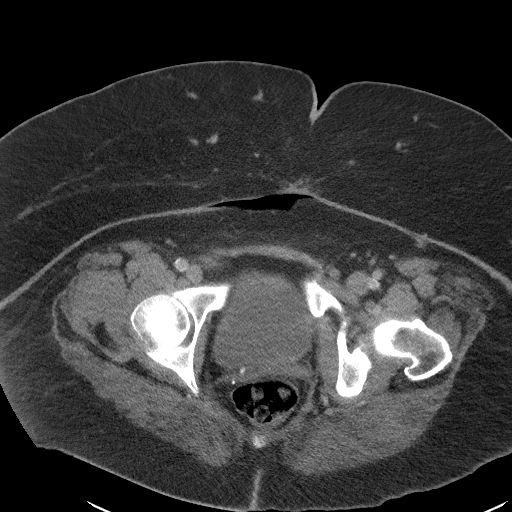
[im 20/98  soft-tissue]
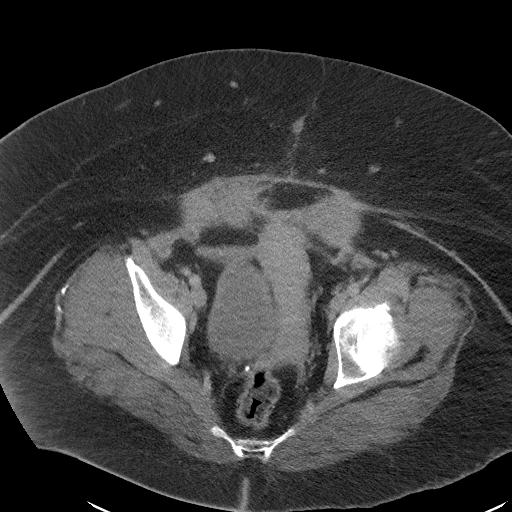
[im 26/98  soft-tissue]
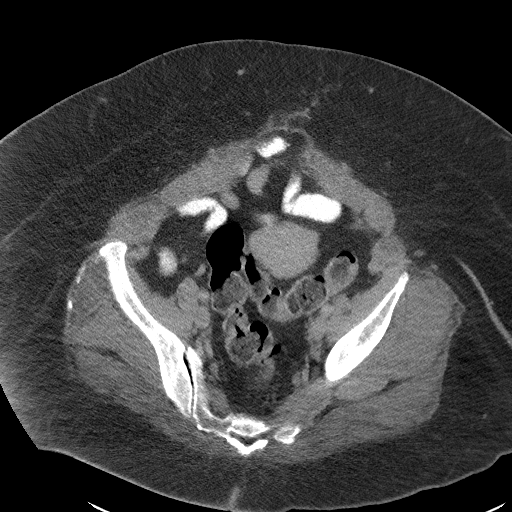
[im 33/98  soft-tissue]
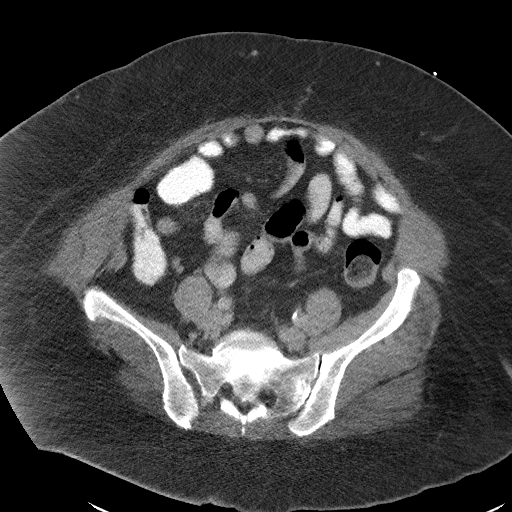
[im 39/98  soft-tissue]
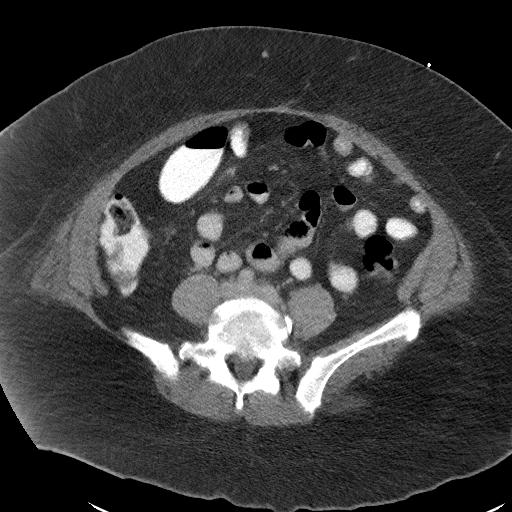
[im 52/98  soft-tissue]
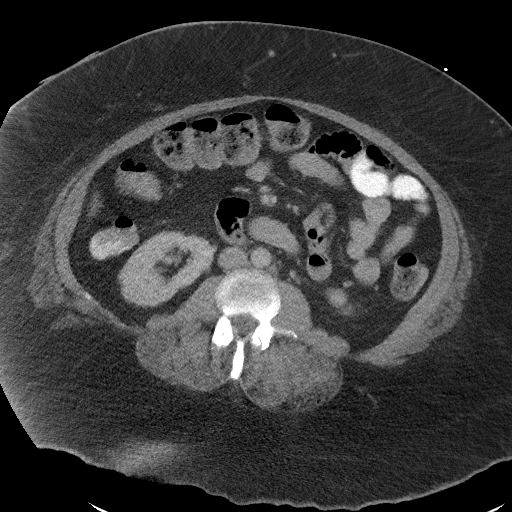
[im 59/98  soft-tissue]
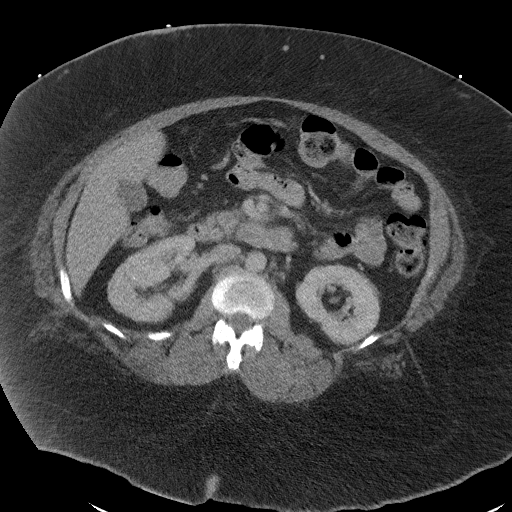
[im 65/98  soft-tissue]
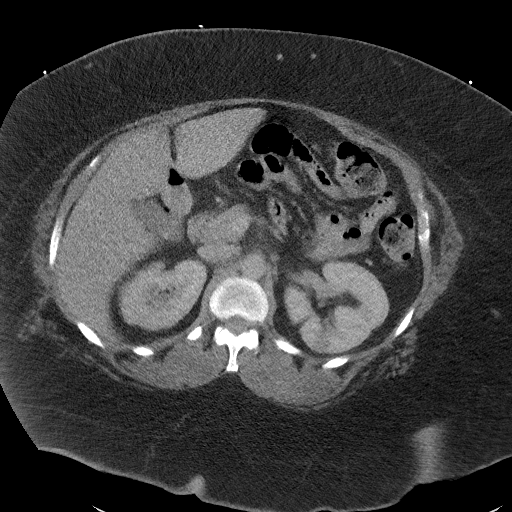
[im 65/98  bone]
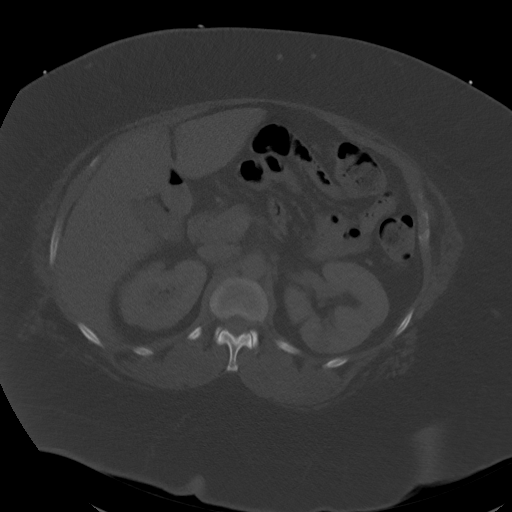
[im 72/98  soft-tissue]
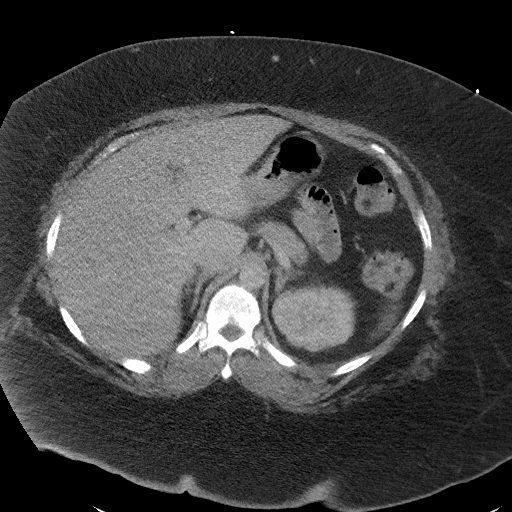
[im 78/98  soft-tissue]
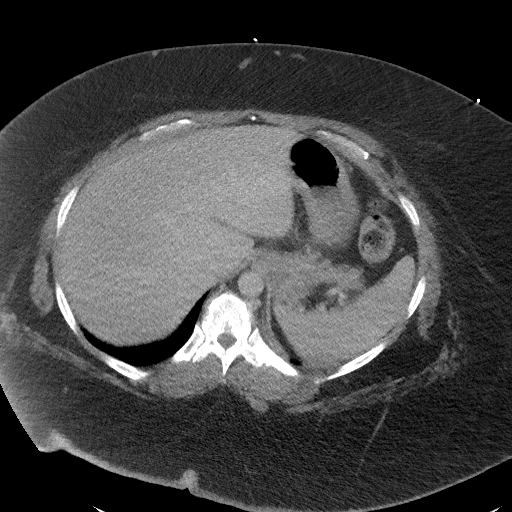
[im 85/98  soft-tissue]
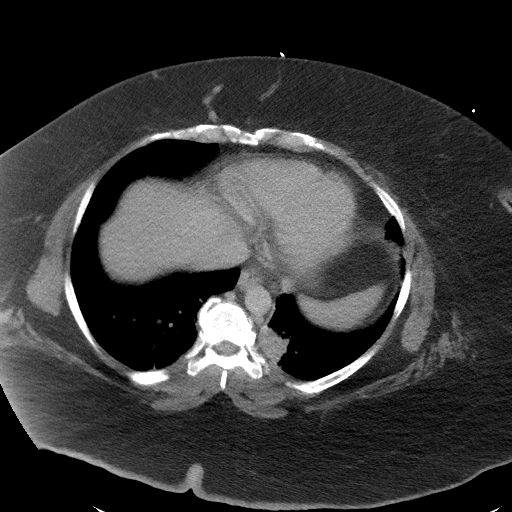
[im 91/98  soft-tissue]
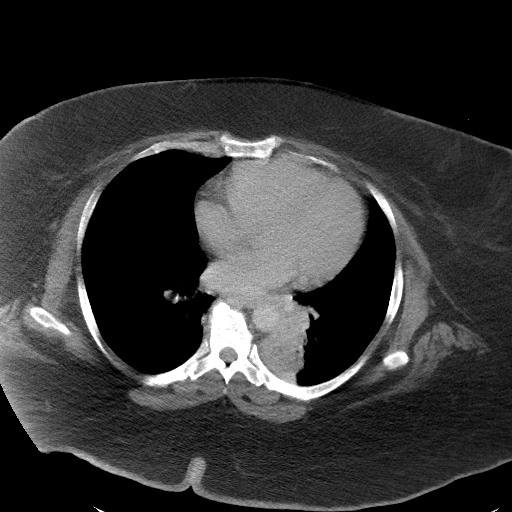

[Series 4: coronal st · coronal · 0.86mm/px · 3 of 104 slices shown]
[im 35/104  soft-tissue]
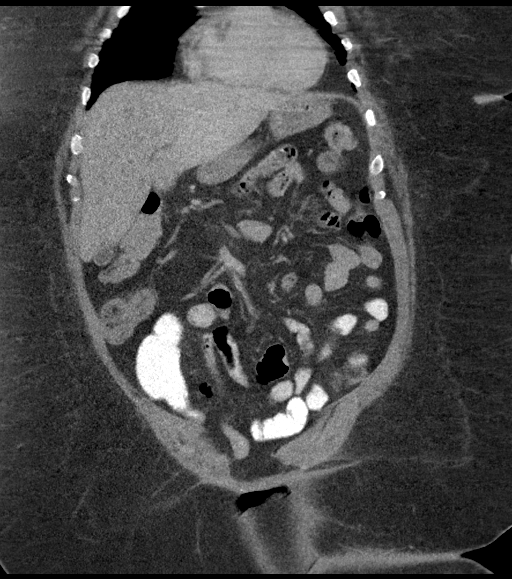
[im 46/104  soft-tissue]
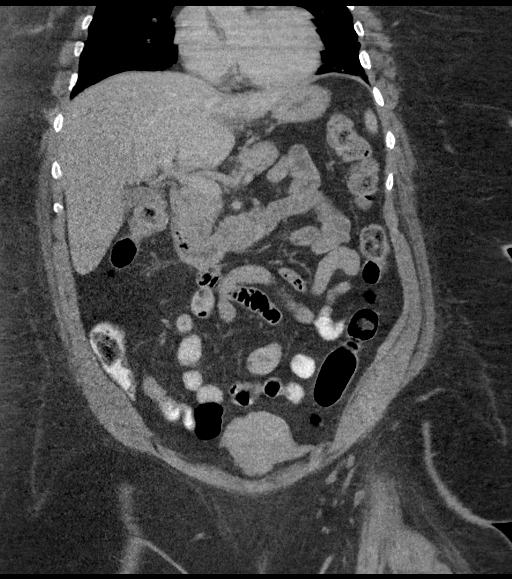
[im 58/104  soft-tissue]
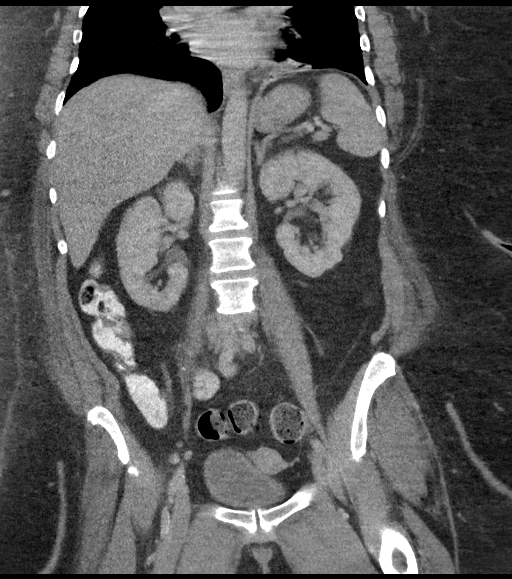

[16 of 46 positions shown; findings below may reference images not displayed]

FINDINGS: Lower chest: Consolidation of medial left lower lobe is identified.
The heart size is enlarged.

Hepatobiliary: Diffuse low density of the liver is identified
without focal liver lesion. Small gallstones identified in the
gallbladder. No inflammation is noted around gallbladder. The
biliary ducts are normal.

Pancreas: Unremarkable. No pancreatic ductal dilatation or
surrounding inflammatory changes.

Spleen: Normal in size without focal abnormality.

Adrenals/Urinary Tract: The bilateral adrenal glands are normal.
There is no nephrolithiasis or hydronephrosis bilaterally.
Low-density lesion is identified in the posterior midpole left
kidney unchanged. The bladder is normal.

Stomach/Bowel: Stomach is within normal limits. Appendix appears
normal. No evidence of bowel wall thickening, distention, or
inflammatory changes.

Vascular/Lymphatic: No significant vascular findings are present. No
enlarged abdominal or pelvic lymph nodes.

Reproductive: Status post hysterectomy. No adnexal masses.

Other: Midline umbilical herniation of mesenteric fat is identified.

Musculoskeletal: Degenerative joint changes of the spine are noted.
IMPRESSION: Medial left lower lobe pneumonia.

No acute abnormality identified in the abdomen pelvis.

Fatty infiltration of liver.

Cholelithiasis.
